# Patient Record
Sex: Male | Born: 1990 | Race: Black or African American | Hispanic: No | Marital: Single | Smoking: Never smoker
Health system: Southern US, Community
[De-identification: ages and names within clinical notes are randomized; demographics above are authoritative.]

## PROBLEM LIST (undated history)

## (undated) DIAGNOSIS — K603 Anal fistula: Secondary | ICD-10-CM

## (undated) DIAGNOSIS — L0291 Cutaneous abscess, unspecified: Secondary | ICD-10-CM

## (undated) DIAGNOSIS — A630 Anogenital (venereal) warts: Secondary | ICD-10-CM

## (undated) DIAGNOSIS — K602 Anal fissure, unspecified: Secondary | ICD-10-CM

## (undated) HISTORY — DX: Anal fistula: K60.3

## (undated) HISTORY — DX: Anogenital (venereal) warts: A63.0

## (undated) HISTORY — DX: Cutaneous abscess, unspecified: L02.91

## (undated) HISTORY — DX: Anal fissure, unspecified: K60.2

---
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
## 2009-08-21 ENCOUNTER — Emergency Department (HOSPITAL_COMMUNITY): Admission: EM | Admit: 2009-08-21 | Discharge: 2009-08-21 | Payer: Self-pay | Admitting: Family Medicine

## 2009-09-04 ENCOUNTER — Emergency Department (HOSPITAL_COMMUNITY): Admission: EM | Admit: 2009-09-04 | Discharge: 2009-09-05 | Payer: Self-pay | Admitting: Emergency Medicine

## 2009-10-11 HISTORY — PX: CONDYLOMA EXCISION/FULGURATION: SHX1389

## 2009-11-10 ENCOUNTER — Emergency Department (HOSPITAL_COMMUNITY)
Admission: EM | Admit: 2009-11-10 | Discharge: 2009-11-10 | Payer: Self-pay | Source: Home / Self Care | Admitting: Family Medicine

## 2010-01-22 ENCOUNTER — Emergency Department (HOSPITAL_COMMUNITY)
Admission: EM | Admit: 2010-01-22 | Discharge: 2010-01-23 | Payer: Self-pay | Source: Home / Self Care | Admitting: Emergency Medicine

## 2010-01-23 ENCOUNTER — Encounter (INDEPENDENT_AMBULATORY_CARE_PROVIDER_SITE_OTHER): Payer: Self-pay | Admitting: *Deleted

## 2010-01-23 ENCOUNTER — Telehealth: Payer: Self-pay | Admitting: Gastroenterology

## 2010-02-05 ENCOUNTER — Encounter (INDEPENDENT_AMBULATORY_CARE_PROVIDER_SITE_OTHER): Payer: Self-pay | Admitting: *Deleted

## 2010-02-05 ENCOUNTER — Ambulatory Visit: Payer: Self-pay | Admitting: Gastroenterology

## 2010-02-05 DIAGNOSIS — K625 Hemorrhage of anus and rectum: Secondary | ICD-10-CM | POA: Insufficient documentation

## 2010-02-05 DIAGNOSIS — E739 Lactose intolerance, unspecified: Secondary | ICD-10-CM | POA: Insufficient documentation

## 2010-02-05 DIAGNOSIS — R195 Other fecal abnormalities: Secondary | ICD-10-CM | POA: Insufficient documentation

## 2010-02-05 LAB — CONVERTED CEMR LAB
ALT: 31 units/L (ref 0–53)
AST: 28 units/L (ref 0–37)
Albumin: 4.2 g/dL (ref 3.5–5.2)
Alkaline Phosphatase: 47 units/L (ref 39–117)
Bilirubin, Direct: 0.1 mg/dL (ref 0.0–0.3)
CO2: 25 meq/L (ref 19–32)
Calcium: 8.8 mg/dL (ref 8.4–10.5)
Chloride: 108 meq/L (ref 96–112)
Eosinophils Relative: 2 % (ref 0.0–5.0)
Glucose, Bld: 85 mg/dL (ref 70–99)
HCT: 45.9 % (ref 39.0–52.0)
Lymphocytes Relative: 37.3 % (ref 12.0–46.0)
Lymphs Abs: 1.3 10*3/uL (ref 0.7–4.0)
Monocytes Relative: 6.1 % (ref 3.0–12.0)
Platelets: 157 10*3/uL (ref 150.0–400.0)
Potassium: 4 meq/L (ref 3.5–5.1)
Saturation Ratios: 33.5 % (ref 20.0–50.0)
Sed Rate: 5 mm/hr (ref 0–22)
Sodium: 138 meq/L (ref 135–145)
Total Protein: 7.3 g/dL (ref 6.0–8.3)
Transferrin: 264.3 mg/dL (ref 212.0–360.0)
WBC: 3.4 10*3/uL — ABNORMAL LOW (ref 4.5–10.5)

## 2010-02-23 ENCOUNTER — Ambulatory Visit: Payer: Self-pay | Admitting: Gastroenterology

## 2010-02-24 ENCOUNTER — Telehealth: Payer: Self-pay | Admitting: Gastroenterology

## 2010-02-25 ENCOUNTER — Ambulatory Visit: Payer: Self-pay | Admitting: Gastroenterology

## 2010-02-25 DIAGNOSIS — B079 Viral wart, unspecified: Secondary | ICD-10-CM | POA: Insufficient documentation

## 2010-02-25 LAB — CONVERTED CEMR LAB

## 2010-03-06 ENCOUNTER — Ambulatory Visit: Payer: Self-pay | Admitting: Gastroenterology

## 2010-03-16 ENCOUNTER — Encounter: Payer: Self-pay | Admitting: Gastroenterology

## 2010-04-09 ENCOUNTER — Encounter: Payer: Self-pay | Admitting: General Surgery

## 2010-04-09 ENCOUNTER — Observation Stay (HOSPITAL_COMMUNITY): Admission: AD | Admit: 2010-04-09 | Discharge: 2010-04-10 | Payer: Self-pay | Admitting: General Surgery

## 2010-04-09 HISTORY — PX: CONDYLOMA EXCISION/FULGURATION: SHX1389

## 2010-04-15 ENCOUNTER — Emergency Department (HOSPITAL_COMMUNITY)
Admission: EM | Admit: 2010-04-15 | Discharge: 2010-04-15 | Payer: Self-pay | Source: Home / Self Care | Admitting: Emergency Medicine

## 2010-07-10 ENCOUNTER — Encounter: Payer: Self-pay | Admitting: Gastroenterology

## 2010-07-14 NOTE — Assessment & Plan Note (Signed)
Summary: RECTAL BLEEDING...AS.   History of Present Illness Visit Type: Initial Visit Primary GI MD: Sheryn Bison MD FACP FAGA Primary Provider: Jasper Loser, MD Chief Complaint: rectal bleeding with bowel movements, constipation History of Present Illness:   20 year old African American male referred through [facility] long emergency room for evaluation of recurrent rectal bleeding. This has been in place for 2 months, apparently was initially evaluated in [place], and colonoscopy was recommended.  Patient has had several months of rectal bleeding and developed acute rectal pain on August 12 requiring urgency room visit. At that time rectal exam was unremarkable the patient was treated Anusol suppositories, lidocaine gel, and MiraLax. He denies current rectal pain, but continues with bright red blood per rectum. He denies upper gastrointestinal or hepatobiliary complaints. His appetite is good, his weight is stable,he does have lactose intolerance. Family history noncontributory.   GI Review of Systems    Reports abdominal pain and  acid reflux.     Location of  Abdominal pain: left side.    Denies belching, bloating, chest pain, dysphagia with liquids, dysphagia with solids, heartburn, loss of appetite, nausea, vomiting, vomiting blood, weight loss, and  weight gain.      Reports black tarry stools, change in bowel habits, constipation, heme positive stool, rectal bleeding, and  rectal pain.     Denies anal fissure, diarrhea, diverticulosis, fecal incontinence, hemorrhoids, irritable bowel syndrome, jaundice, light color stool, and  liver problems.    Current Medications (verified): 1)  Anusol-Hc 25 Mg Supp (Hydrocortisone Acetate) .... As Needed 2)  Polyethylene Glycol 3350  Powd (Polyethylene Glycol 3350) .... Take As Directed Once Daily As Needed 3)  Lidocaine Hcl 4 % Soln (Lidocaine Hcl) .... Apply As Needed For Pain  Allergies (verified): No Known Drug Allergies  Past  History:  Past medical, surgical, family and social histories (including risk factors) reviewed for relevance to current acute and chronic problems.  Past Medical History: Reviewed history from 02/04/2010 and no changes required. Asthma Allergies  Past Surgical History: Unremarkable  Family History: Reviewed history and no changes required. Family History of Colon Cancer:GM Family History of Heart Disease:   Social History: Reviewed history from 02/04/2010 and no changes required. Patient has never smoked.  Alcohol Use - yes Occasional Illicit Drug Use - no Occupation: Student  Review of Systems       The patient complains of allergy/sinus.  The patient denies anemia, anxiety-new, arthritis/joint pain, back pain, blood in urine, breast changes/lumps, change in vision, confusion, cough, coughing up blood, depression-new, fainting, fatigue, fever, headaches-new, hearing problems, heart murmur, heart rhythm changes, itching, menstrual pain, muscle pains/cramps, night sweats, nosebleeds, pregnancy symptoms, shortness of breath, skin rash, sleeping problems, sore throat, swelling of feet/legs, swollen lymph glands, thirst - excessive , urination - excessive , urination changes/pain, urine leakage, vision changes, and voice change.    Vital Signs:  Patient profile:   20 year old male Height:      73 inches Weight:      186 pounds BMI:     24.63 Pulse rate:   72 / minute Pulse rhythm:   regular BP sitting:   124 / 86  (left arm) Cuff size:   regular  Vitals Entered By: June McMurray CMA Duncan Dull) (February 05, 2010 10:52 AM)  Physical Exam  General:  Well developed, well nourished, no acute distress.healthy appearing.   Head:  Normocephalic and atraumatic. Eyes:  PERRLA, no icterus.exam deferred to patient's ophthalmologist.   Neck:  Supple;  no masses or thyromegaly. Lungs:  Clear throughout to auscultation. Heart:  Regular rate and rhythm; no murmurs, rubs,  or bruits. Abdomen:   Soft, nontender and nondistended. No masses, hepatosplenomegaly or hernias noted. Normal bowel sounds. Rectal:  Normal exam.hemoccult positive.  Some mucus and blood noted. Prostate:  .normal size prostate.   Extremities:  No clubbing, cyanosis, edema or deformities noted. Neurologic:  Alert and  oriented x4;  grossly normal neurologically. Cervical Nodes:  No significant cervical adenopathy. Psych:  Alert and cooperative. Normal mood and affect.   Impression & Recommendations:  Problem # 1:  RECTAL BLEEDING (ICD-569.3) Assessment Unchanged No obvious fissure or fistula noted or mass on rectal exam. 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 Labs have been ordered and we will schedule colonoscopy. He is to continue stool softeners as tolerated and I have prescribed Canasa 1 g suppositories at bed time pending further workup. Orders: TLB-CBC Platelet - w/Differential (85025-CBCD) TLB-BMP (Basic Metabolic Panel-BMET) (80048-METABOL) TLB-Hepatic/Liver Function Pnl (80076-HEPATIC) TLB-TSH (Thyroid Stimulating Hormone) (84443-TSH) TLB-B12, Serum-Total ONLY (16109-U04) TLB-Ferritin (82728-FER) TLB-Folic Acid (Folate) (82746-FOL) TLB-IBC Pnl (Iron/FE;Transferrin) (83550-IBC) TLB-Sedimentation Rate (ESR) (85652-ESR)  Problem # 2:  BLOOD IN STOOL, OCCULT (ICD-792.1) Assessment: Unchanged labs ordered. Orders: TLB-CBC Platelet - w/Differential (85025-CBCD) TLB-BMP (Basic Metabolic Panel-BMET) (80048-METABOL) TLB-Hepatic/Liver Function Pnl (80076-HEPATIC) TLB-TSH (Thyroid Stimulating Hormone) (84443-TSH) TLB-B12, Serum-Total ONLY (54098-J19) TLB-Ferritin (82728-FER) TLB-Folic Acid (Folate) (82746-FOL) TLB-IBC Pnl (Iron/FE;Transferrin) (83550-IBC) TLB-Sedimentation Rate (ESR) (85652-ESR)  Problem # 3:  LACTOSE INTOLERANCE (ICD-271.3) Assessment: Unchanged  Patient Instructions: 1)  Please go to the  basement for lab work. 2)  Begin canasa suppository at bedtime. 1 gm. 3)  You are scheduled for a colonoscopy. 4)  The medication list was reviewed and reconciled.  All changed / newly prescribed medications were explained.  A complete medication list was provided to the patient / caregiver. 5)  Constipation and Hemorrhoids brochure given.  6)  Colonoscopy and Flexible Sigmoidoscopy brochure given.  7)  Conscious Sedation brochure given.  8)  Copy sent to : Dr. Jasper Loser.  Appended Document: RECTAL BLEEDING...AS.    Clinical Lists Changes  Medications: Added new medication of MOVIPREP 100 GM  SOLR (PEG-KCL-NACL-NASULF-NA ASC-C) As per prep instructions. - Signed Added new medication of CANASA 1000 MG  SUPP (MESALAMINE) 1 per rectum at bedtime - Signed Rx of MOVIPREP 100 GM  SOLR (PEG-KCL-NACL-NASULF-NA ASC-C) As per prep instructions.;  #1 x 0;  Signed;  Entered by: Ashok Cordia RN;  Authorized by: Mardella Layman MD [facility];  Method used: Electronically to [facility], [place], [place]  [id number], Ph: [phone], Fax: [phone] Rx of CANASA 1000 MG  SUPP (MESALAMINE) 1 per rectum at bedtime;  #30 x 0;  Signed;  Entered by: Ashok Cordia RN;  Authorized by: Mardella Layman MD [facility];  Method used: Electronically to [facility], [place], [place]  [id number], Ph: [phone], Fax: [phone] Orders: Added new Test order of Colonoscopy (Colon) - Signed    Prescriptions: CANASA 1000 MG  SUPP (MESALAMINE) 1 per rectum at bedtime  #30 x 0   Entered by:   Ashok Cordia RN   Authorized by:   Mardella Layman MD [facility]   Signed by:   Ashok Cordia RN on 02/05/2010   Method used:   Electronically to        [facility] (retail)  [address]       [place], [place]  [id number]       Ph: [phone]       Fax: [phone]   RxID:   [id number] MOVIPREP 100 GM  SOLR  (PEG-KCL-NACL-NASULF-NA ASC-C) As per prep instructions.  #1 x 0   Entered by:   Ashok Cordia RN   Authorized by:   Mardella Layman MD [facility]   Signed by:   Ashok Cordia RN on 02/05/2010   Method used:   Electronically to        [facility] (retail)       [address]       [place], [place]  [id number]       Ph: [phone]       Fax: [phone]   RxID:   [id number]

## 2010-07-14 NOTE — Letter (Signed)
Summary: New Patient letter  [facility]  [address] [place], [place] [address]   Phone: [phone]  Fax: [phone]       01/23/2010 MRN: [id number]  [facility] [address], [place]  [phone]  Dear Jack Smith,  Welcome to the Gastroenterology Division at [facility].    You are scheduled to see Dr. Jarold Motto on 03/10/2010 at 2:00PM on the 3rd floor at [facility], [address]  We ask that you try to arrive at our office 15 minutes prior to your appointment time to allow for check-in.  We would like you to complete the enclosed self-administered evaluation form prior to your visit and bring it with you on the day of your appointment.  We will review it with you.  Also, please bring a complete list of all your medications or, if you prefer, bring the medication bottles and we will list them.  Please bring your insurance card so that we may make a copy of it.  If your insurance requires a referral to see a specialist, please bring your referral form from your primary care physician.  Co-payments are due at the time of your visit and may be paid by cash, check or credit card.     Your office visit will consist of a consult with your physician (includes a physical exam), any laboratory testing he/she may order, scheduling of any necessary diagnostic testing (e.g. x-ray, ultrasound, CT-scan), and scheduling of a procedure (e.g. Endoscopy, Colonoscopy) if required.  Please allow enough time on your schedule to allow for any/all of these possibilities.    If you cannot keep your appointment, please call [phone] to cancel or reschedule prior to your appointment date.  This allows [place] the opportunity to schedule an appointment for another patient in need of care.  If you do not cancel or reschedule by 5 p.m. the business day prior to your appointment date, you will be charged a $50.00 late cancellation/no-show fee.    Thank you for choosing [facility] for your medical needs.  We appreciate the opportunity to care for you.  Please visit [place] at our website  to learn more about our practice.                     Sincerely,                                                             The Gastroenterology Division

## 2010-07-14 NOTE — Progress Notes (Signed)
Summary: Triage  Phone Note Call from Patient Call back at Home Phone [phone]   Caller: Patient Call For: Dr. Jarold Motto Reason for Call: Talk to Nurse Summary of Call: Pt was told yesterday he has Condyloma and would like to discuss Initial call taken by: Karna Christmas,  February 24, 2010 9:00 AM  Follow-up for Phone Call        await biopsy results first.... Follow-up by: Mardella Layman MD FACG,  February 24, 2010 9:48 AM  Additional Follow-up for Phone Call Additional follow up Details #1::        spoke to the patient and scheduled follow up ov and a appt with [facility] Additional Follow-up by: Harlow Mares CMA (AAMA),  March 02, 2010 11:01 AM

## 2010-07-14 NOTE — Procedures (Signed)
Summary: Colonoscopy  Patient: Jack Smith Note: All result statuses are Final unless otherwise noted.  Tests: (1) Colonoscopy (COL)   COL Colonoscopy           DONE     [facility]     [address]     [place], [place]  [address]           COLONOSCOPY PROCEDURE REPORT           PATIENT:  Jack Smith, Jack Smith  MR#:  [id number]     BIRTHDATE:  22-Nov-1990, 19 yrs. old  GENDER:  male     ENDOSCOPIST:  Vania Rea. Jarold Motto, MD, [facility]     REF. BY:     PROCEDURE DATE:  02/23/2010     PROCEDURE:  Colonoscopy with biopsy     ASA CLASS:  Class I     INDICATIONS:  rectal bleeding     MEDICATIONS:   Fentanyl 75 mcg IV, Versed 7 mg IV           DESCRIPTION OF PROCEDURE:   After the risks benefits and     alternatives of the procedure were thoroughly explained, informed     consent was obtained.  Digital rectal exam was performed and     revealed no abnormalities.   The LB [id number] endoscope     was introduced through the anus and advanced to the cecum, which     was identified by both the appendix and ileocecal valve, without     limitations.  The quality of the prep was excellent, using     MoviPrep.  The instrument was then slowly withdrawn as the colon     was fully examined.     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 ANAL AND     RECTAL LESIONS BIOPSIED.  No polyps or cancers were seen.  This     was otherwise a normal examination of the colon.   SEE PICTURES.     The scope was then withdrawn from the patient and the procedure     completed.           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     REPEAT EXAM:  No           ______________________________     Vania Rea. Jarold Motto, MD, Clement[facility] Graham           CC:        n.     eSIGNED:   Vania Rea. Patterson at 02/23/2010 03:56 PM           Schueler, [name], [phone]  Note: An exclamation mark (!) indicates a result that was not dispersed into the flowsheet. Document Creation Date: 02/23/2010 3:57 PM _______________________________________________________________________  (1) Order result status: Final Collection or observation date-time: 02/23/2010 15:45 Requested date-time:  Receipt date-time:  Reported date-time:  Referring Physician:   Ordering Physician: Sheryn Bison [phone]) Specimen Source:  Source: Launa Grill Order Number: [id number] Lab site:

## 2010-07-14 NOTE — Letter (Signed)
Summary: [facility]   Imported By: Lennie Odor 04/08/2010 12:07:33  _____________________________________________________________________  External Attachment:    Type:   Image     Comment:   External Document

## 2010-07-14 NOTE — Letter (Signed)
Summary: [facility] Instructions  [facility]  [address] [place], [place] [address]   Phone: [phone]  Fax: [phone]       Selby Smelcer    1991-01-10    MRN: [id number]        Procedure Day /Date: Monday, 02/23/10     Arrival Time: 2:30      Procedure Time: 3:30     [facility] of Procedure:                    [facility]                        PREPARATION FOR COLONOSCOPY WITH MOVIPREP   Starting 5 days prior to your procedure 02/18/10 do not eat nuts, seeds, popcorn, corn, beans, peas,  salads, or any raw vegetables.  Do not take any fiber supplements (e.g. Metamucil, Citrucel, and Benefiber).  THE DAY BEFORE YOUR PROCEDURE         DATE: 02/22/10   DAY: Sunday  1.  Drink clear liquids the entire day-NO SOLID FOOD  2.  Do not drink anything colored red or purple.  Avoid juices with pulp.  No orange juice.  3.  Drink at least 64 oz. (8 glasses) of fluid/clear liquids during the day to prevent dehydration and help the prep work efficiently.  CLEAR LIQUIDS INCLUDE: Water Jello Ice Popsicles Tea (sugar ok, no milk/cream) Powdered fruit flavored drinks Coffee (sugar ok, no milk/cream) Gatorade Juice: apple, white grape, white cranberry  Lemonade Clear bullion, consomm, broth Carbonated beverages (any kind) Strained chicken noodle soup Hard Candy                             4.  In the morning, mix first dose of MoviPrep solution:    Empty 1 Pouch A and 1 Pouch B into the disposable container    Add lukewarm drinking water to the top line of the container. Mix to dissolve    Refrigerate (mixed solution should be used within 24 hrs)  5.  Begin drinking the prep at 5:00 p.m. The MoviPrep container is divided by 4 marks.   Every 15 minutes drink the solution down to the next mark (approximately 8 oz) until the full liter is complete.   6.  Follow completed prep with 16 oz of clear liquid of your choice (Nothing red or  purple).  Continue to drink clear liquids until bedtime.  7.  Before going to bed, mix second dose of MoviPrep solution:    Empty 1 Pouch A and 1 Pouch B into the disposable container    Add lukewarm drinking water to the top line of the container. Mix to dissolve    Refrigerate  THE DAY OF YOUR PROCEDURE      DATE: 02/23/10    DAY: Monday  Beginning at 10:30a.m. (5 hours before procedure):         1. Every 15 minutes, drink the solution down to the next mark (approx 8 oz) until the full liter is complete.  2. Follow completed prep with 16 oz. of clear liquid of your choice.    3. You may drink clear liquids until _  _ (2 HOURS BEFORE PROCEDURE).   MEDICATION INSTRUCTIONS  Unless otherwise instructed, you should take regular prescription medications with a Cardona sip of water   as early as possible  the morning of your procedure.         OTHER INSTRUCTIONS  You will need a responsible adult at least 21 years of age to accompany you and drive you home.   This person must remain in the waiting room during your procedure.  Wear loose fitting clothing that is easily removed.  Leave jewelry and other valuables at home.  However, you may wish to bring a book to read or  an iPod/MP3 player to listen to music as you wait for your procedure to start.  Remove all body piercing jewelry and leave at home.  Total time from sign-in until discharge is approximately 2-3 hours.  You should go home directly after your procedure and rest.  You can resume normal activities the  day after your procedure.  The day of your procedure you should not:   Drive   Make legal decisions   Operate machinery   Drink alcohol   Return to work  You will receive specific instructions about eating, activities and medications before you leave.    The above instructions have been reviewed and explained to me by   _______________________    I fully understand and can verbalize these  instructions _____________________________ Date _________

## 2010-07-14 NOTE — Letter (Signed)
Summary: Patient Notice- Colon Biospy Results  [facility]  [address] [place], [place] [address]   Phone: [phone]  Fax: [phone]        February 25, 2010 MRN: [id number]    [facility] [address] [place], [address]   Dear Mr. Ballman,  I am pleased to inform you that the biopsies taken during your recent colonoscopy did not show any evidence of cancer upon pathologic examination.  Additional information/recommendations:  __No further action is needed at this time.  Please follow-up with      your primary care physician for your other healthcare needs.  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 AND FURTHER BLOOD WORK.  __Continue with the treatment plan as outlined on the day of your      exam.  __You should have a repeat colonoscopy examination for this problem           in _ years.  Please call us if you are having persistent problems or have questions about your condition that have not been fully answered at this time.  Sincerely,  Mardella Layman MD [facility]   This letter has been electronically signed by your physician.   Appended Document: Patient Notice- Colon Biospy Results letter mailed

## 2010-07-14 NOTE — Assessment & Plan Note (Signed)
Summary: Follow up CONDYLOMA/lk   History of Present Illness Primary GI MD: Sheryn Bison MD FACP FAGA Primary Provider: Jasper Loser, MD Chief Complaint: f/u pathology after colon. Pt still having rectal bleeding and pain with BM's. History of Present Illness:   This Patient is currently asymptomatic except for occasional bright red blood per rectum. Colonoscopy showed the presence of condyloma a biopsy. HIV titer was negative and test for syphilis was negative. Patient is surprised with this diagnosis relating that he does not have unprotected sex. We have scheduled him to see Dr. Gaynelle Adu and surgery for surgical treatment of his rather extensive anal condyloma. Several options exist between surgical excision and podofylin administration topically. I have given the patient printed information concerning the human papilloma virus, its etiology, clinical course and therapy.   GI Review of Systems      Denies abdominal pain, acid reflux, belching, bloating, chest pain, dysphagia with liquids, dysphagia with solids, heartburn, loss of appetite, nausea, vomiting, vomiting blood, weight loss, and  weight gain.      Reports rectal bleeding and  rectal pain.     Denies anal fissure, black tarry stools, change in bowel habit, constipation, diarrhea, diverticulosis, fecal incontinence, heme positive stool, hemorrhoids, irritable bowel syndrome, jaundice, light color stool, and  liver problems.    Current Medications (verified): 1)  None  Allergies (verified): No Known Drug Allergies  Past History:  Past medical, surgical, family and social histories (including risk factors) reviewed for relevance to current acute and chronic problems.  Past Medical History: Reviewed history from 02/04/2010 and no changes required. Asthma Allergies  Past Surgical History: Reviewed history from 02/05/2010 and no changes required. Unremarkable  Family History: Reviewed history from 02/05/2010 and no  changes required. Family History of Colon Cancer:GM Family History of Heart Disease:   Social History: Reviewed history from 02/05/2010 and no changes required. Patient has never smoked.  Alcohol Use - yes Occasional Illicit Drug Use - no Occupation: Student  Review of Systems  The patient denies allergy/sinus, anemia, anxiety-new, arthritis/joint pain, back pain, blood in urine, breast changes/lumps, change in vision, confusion, cough, coughing up blood, depression-new, fainting, fatigue, fever, headaches-new, hearing problems, heart murmur, heart rhythm changes, itching, menstrual pain, muscle pains/cramps, night sweats, nosebleeds, pregnancy symptoms, shortness of breath, skin rash, sleeping problems, sore throat, swelling of feet/legs, swollen lymph glands, thirst - excessive , urination - excessive , urination changes/pain, urine leakage, vision changes, and voice change.    Vital Signs:  Patient profile:   20 year old male Height:      73 inches Weight:      186.38 pounds Pulse rate:   80 / minute Pulse rhythm:   regular BP sitting:   138 / 72  (left arm) Cuff size:   regular  Vitals Entered By: Christie Nottingham CMA Duncan Dull) (March 06, 2010 9:54 AM)  Physical Exam  General:  Well developed, well nourished, no acute distress.healthy appearing.   Psych:  Alert and cooperative. Normal mood and affect.   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 Gaynelle Adu on October 3 of this year. And also reviewed probable sexual transmission of this virus and have urged continued safe sexual practices. His HIV titer was negative and PCR for syphilis was negative.  Problem # 2:  LACTOSE INTOLERANCE (ICD-271.3) Assessment: Unchanged  Problem # 3:  RECTAL BLEEDING (ICD-569.3) Assessment: Unchanged  Patient Instructions: 1)  Copy sent to :  Jasper Loser, MD and Dr. Gaynelle Adu at [facility] surgery 2)   Patient given information on Condyloma. 3)  Keep your appt with [facility] on 03/16/2010. 4)  Please continue current medications.  5)  The medication list was reviewed and reconciled.  All changed / newly prescribed medications were explained.  A complete medication list was provided to the patient / caregiver.

## 2010-07-14 NOTE — Progress Notes (Signed)
Summary: triage  Phone Note Call from Patient Call back at [phone]   Caller: mom--denise Call For: Doc of the day Reason for Call: Talk to Nurse Summary of Call: Mother would like he son to be seen today or sooner than appt date 9-27 for rectal bleeding and rectal pain. Patient was seen in the ER last night and was told to see a gi asap. Initial call taken by: Tawni Levy,  January 23, 2010 11:00 AM  Follow-up for Phone Call        Appt. moved up to 02/05/10..Mother will inform pt.and tell him about cx. policy and co-pay. Follow-up by: Teryl Lucy RN,  January 23, 2010 11:58 AM

## 2010-08-20 NOTE — Letter (Signed)
Summary: [facility]   Imported By: Lennie Odor 08/10/2010 10:22:14  _____________________________________________________________________  External Attachment:    Type:   Image     Comment:   External Document

## 2010-08-26 LAB — CBC
MCHC: 36.6 g/dL — ABNORMAL HIGH (ref 30.0–36.0)
Platelets: 157 10*3/uL (ref 150–400)
RDW: 12.2 % (ref 11.5–15.5)
WBC: 3.3 10*3/uL — ABNORMAL LOW (ref 4.0–10.5)

## 2010-08-31 LAB — GC/CHLAMYDIA PROBE AMP, GENITAL: Chlamydia, DNA Probe: NEGATIVE

## 2010-08-31 LAB — HIV ANTIBODY (ROUTINE TESTING W REFLEX): HIV: NONREACTIVE

## 2010-09-07 LAB — POCT I-STAT, CHEM 8
Calcium, Ion: 1.13 mmol/L (ref 1.12–1.32)
Chloride: 105 mEq/L (ref 96–112)
Creatinine, Ser: 1 mg/dL (ref 0.4–1.5)
Glucose, Bld: 84 mg/dL (ref 70–99)
HCT: 47 % (ref 39.0–52.0)
Hemoglobin: 16 g/dL (ref 13.0–17.0)
Potassium: 3.8 mEq/L (ref 3.5–5.1)

## 2010-09-28 ENCOUNTER — Emergency Department (HOSPITAL_COMMUNITY)
Admission: EM | Admit: 2010-09-28 | Discharge: 2010-09-29 | Disposition: A | Discharge status: Home / Self Care | Payer: Managed Care, Other (non HMO) | Attending: Emergency Medicine | Admitting: Emergency Medicine

## 2010-09-28 ENCOUNTER — Emergency Department (HOSPITAL_COMMUNITY): Payer: Managed Care, Other (non HMO)

## 2010-09-28 DIAGNOSIS — J069 Acute upper respiratory infection, unspecified: Secondary | ICD-10-CM | POA: Insufficient documentation

## 2010-09-28 DIAGNOSIS — R5381 Other malaise: Secondary | ICD-10-CM | POA: Insufficient documentation

## 2010-09-28 DIAGNOSIS — R059 Cough, unspecified: Secondary | ICD-10-CM | POA: Insufficient documentation

## 2010-09-28 DIAGNOSIS — Y849 Medical procedure, unspecified as the cause of abnormal reaction of the patient, or of later complication, without mention of misadventure at the time of the procedure: Secondary | ICD-10-CM | POA: Insufficient documentation

## 2010-09-28 DIAGNOSIS — R509 Fever, unspecified: Secondary | ICD-10-CM | POA: Insufficient documentation

## 2010-09-28 DIAGNOSIS — J45909 Unspecified asthma, uncomplicated: Secondary | ICD-10-CM | POA: Insufficient documentation

## 2010-09-28 DIAGNOSIS — K625 Hemorrhage of anus and rectum: Secondary | ICD-10-CM | POA: Insufficient documentation

## 2010-09-28 DIAGNOSIS — R05 Cough: Secondary | ICD-10-CM | POA: Insufficient documentation

## 2010-09-28 DIAGNOSIS — T8183XA Persistent postprocedural fistula, initial encounter: Secondary | ICD-10-CM | POA: Insufficient documentation

## 2010-09-28 LAB — DIFFERENTIAL
Basophils Absolute: 0 10*3/uL (ref 0.0–0.1)
Eosinophils Relative: 2 % (ref 0–5)
Lymphocytes Relative: 14 % (ref 12–46)
Monocytes Absolute: 0.5 10*3/uL (ref 0.1–1.0)
Monocytes Relative: 5 % (ref 3–12)
Neutro Abs: 8 10*3/uL — ABNORMAL HIGH (ref 1.7–7.7)

## 2010-09-28 LAB — URINALYSIS, ROUTINE W REFLEX MICROSCOPIC
Bilirubin Urine: NEGATIVE
Glucose, UA: 250 mg/dL — AB
Hgb urine dipstick: NEGATIVE
Ketones, ur: 15 mg/dL — AB
Protein, ur: 30 mg/dL — AB
Urobilinogen, UA: 0.2 mg/dL (ref 0.0–1.0)

## 2010-09-28 LAB — CBC
HCT: 45.2 % (ref 39.0–52.0)
Hemoglobin: 15.8 g/dL (ref 13.0–17.0)
MCH: 30.2 pg (ref 26.0–34.0)
MCHC: 35 g/dL (ref 30.0–36.0)
RDW: 12.2 % (ref 11.5–15.5)

## 2010-09-28 LAB — COMPREHENSIVE METABOLIC PANEL
ALT: 14 U/L (ref 0–53)
AST: 22 U/L (ref 0–37)
Alkaline Phosphatase: 55 U/L (ref 39–117)
CO2: 26 mEq/L (ref 19–32)
Calcium: 9.2 mg/dL (ref 8.4–10.5)
Chloride: 103 mEq/L (ref 96–112)
GFR calc Af Amer: >60 mL/min (ref >60)
GFR calc non Af Amer: >60 mL/min (ref >60)
Glucose, Bld: 85 mg/dL (ref 70–99)
Potassium: 3.6 mEq/L (ref 3.5–5.1)
Sodium: 140 mEq/L (ref 135–145)
Total Bilirubin: 0.8 mg/dL (ref 0.3–1.2)

## 2010-09-29 LAB — URINE CULTURE: Colony Count: NO GROWTH

## 2010-10-05 NOTE — Consult Note (Signed)
  NAME:  Jack Smith, Jack Smith                ACCOUNT NO.:  [id number]  MEDICAL RECORD NO.:  [id number]           PATIENT TYPE:  E  LOCATION:  WLED                         FACILITY:  [facility]  PHYSICIAN:  Adolph Pollack, M.D.DATE OF BIRTH:  December 26, 1990  DATE OF CONSULTATION:  09/28/2010 DATE OF DISCHARGE:  09/29/2010                                CONSULTATION   PHYSICIAN REQUESTING:  Carleene Cooper, M.D.  REASON FOR CONSULTATION:  Rectal bleeding, pain.  HISTORY:  This is a 20 year old male who presented to the emergency department, feeling weak, having fever and chills, cough, some shortness of breath, and nasal congestion.  He also complained about his last 2 bowel movements having a little bit of bright red blood per rectum and some pain.  He was seen by Dr. Ignacia Palma who noticed him to have a tender perianal region and was concerned about anorectal abscess and now was asked to see him.  In speaking with him, 4-5 days ago, he had excision of a thrombosed hemorrhoid by Dr. Bertram Savin.  He also has a history of an anorectal abscess and Dr. [name] has seen him and is concerned for the fistula in ano and he is due to have an operation for that on April 30th.  PAST MEDICAL HISTORY: 1. Condyloma acuminatum. 2. Perirectal abscess.  PREVIOUS OPERATIONS: 1. Excision of condyloma acuminata. 2. Incision and drainage of perirectal abscess.  ALLERGIES:  None.  MEDICATIONS:  None.  SOCIAL HISTORY:  Single male, occasionally drinks, nonsmoker in school.  REVIEW OF SYSTEMS:  Generally, he reports having some weakness and fever and chills began today.  PULMONARY:  He has been having some cough and intermittent dyspnea.  GI:  He has had chronic perianal pain ever since is rectal abscess, drainage, and his pain now is really no different.  PHYSICAL EXAMINATION:  GENERAL:  A well-developed, well-nourished male in no acute distress, pleasant, and cooperative. VITAL SIGNS:  Temperature is 101.3,  blood pressure 134/82, pulse 79, respiratory rate 16, O2 sats 98% room air.  An anorectal region, right posterior anal area, there is an open wound, it is tender.  There is some induration around this.  There is no fluctuance present.  There is no drainage.  He has normal sphincter tone.  There is no blood.  LABORATORY DATA:  Notable for normal white blood cell count.  IMPRESSION: 1. Upper respiratory infection. 2. Post excision of thrombosed external hemorrhoid with expected     postprocedure swelling and there is no evidence of abscess.  PLAN:  He can be treated for the upper respiratory infection by the emergency department physician.  We will have him discussed this with Dr. [name] to have him follow up with him as needed for the perianal problems.     Adolph Pollack, M.D.     Kari Baars  D:  09/28/2010  T:  09/29/2010  Job:  [id number]  Electronically Signed by Avel Peace M.D. on 10/05/2010 02:16:10 PM

## 2010-10-12 ENCOUNTER — Observation Stay (HOSPITAL_COMMUNITY)
Admission: RE | Admit: 2010-10-12 | Discharge: 2010-10-13 | Disposition: A | Discharge status: Home / Self Care | Payer: Managed Care, Other (non HMO) | Source: Ambulatory Visit | Attending: General Surgery | Admitting: General Surgery

## 2010-10-12 DIAGNOSIS — K603 Anal fistula, unspecified: Principal | ICD-10-CM | POA: Insufficient documentation

## 2010-10-12 DIAGNOSIS — B078 Other viral warts: Secondary | ICD-10-CM | POA: Insufficient documentation

## 2010-10-12 DIAGNOSIS — K602 Anal fissure, unspecified: Secondary | ICD-10-CM | POA: Insufficient documentation

## 2010-10-12 LAB — SURGICAL PCR SCREEN
MRSA, PCR: NEGATIVE
Staphylococcus aureus: NEGATIVE

## 2010-11-05 ENCOUNTER — Inpatient Hospital Stay: Admission: AD | Admit: 2010-11-05 | Payer: Self-pay | Source: Ambulatory Visit | Admitting: General Surgery

## 2010-11-05 ENCOUNTER — Other Ambulatory Visit: Payer: Self-pay | Admitting: General Surgery

## 2010-11-05 DIAGNOSIS — K611 Rectal abscess: Secondary | ICD-10-CM

## 2010-11-06 ENCOUNTER — Other Ambulatory Visit (HOSPITAL_COMMUNITY): Payer: Managed Care, Other (non HMO)

## 2010-11-06 ENCOUNTER — Ambulatory Visit (HOSPITAL_COMMUNITY)
Admission: RE | Admit: 2010-11-06 | Discharge: 2010-11-07 | Disposition: A | Discharge status: Home / Self Care | Payer: Managed Care, Other (non HMO) | Source: Ambulatory Visit | Attending: General Surgery | Admitting: General Surgery

## 2010-11-06 ENCOUNTER — Ambulatory Visit (HOSPITAL_COMMUNITY)
Admission: RE | Admit: 2010-11-06 | Discharge: 2010-11-06 | Disposition: A | Discharge status: Home / Self Care | Payer: Managed Care, Other (non HMO) | Source: Ambulatory Visit | Attending: General Surgery | Admitting: General Surgery

## 2010-11-06 DIAGNOSIS — K611 Rectal abscess: Secondary | ICD-10-CM

## 2010-11-06 DIAGNOSIS — K612 Anorectal abscess: Secondary | ICD-10-CM | POA: Insufficient documentation

## 2010-11-06 DIAGNOSIS — K602 Anal fissure, unspecified: Secondary | ICD-10-CM | POA: Insufficient documentation

## 2010-11-06 HISTORY — PX: OTHER SURGICAL HISTORY: SHX169

## 2010-11-06 MED ORDER — GADOBENATE DIMEGLUMINE 529 MG/ML IV SOLN
15.0000 mL | Freq: Once | INTRAVENOUS | Status: AC
  Administered 2010-11-06: 15 mL via INTRAVENOUS

## 2010-11-06 NOTE — Op Note (Signed)
NAME:  Jack Smith, Jack Smith                ACCOUNT NO.:  [id number]  MEDICAL RECORD NO.:  [id number]           PATIENT TYPE:  O  LOCATION:  5118                         FACILITY:  [facility]  PHYSICIAN:  [name]. [name], MD     DATE OF BIRTH:  1991/05/28  DATE OF PROCEDURE:  10/12/2010 DATE OF DISCHARGE:                              OPERATIVE REPORT   PREOPERATIVE DIAGNOSES:  Perianal drainage, probable fistula in ano, anal canal warts.  POSTOPERATIVE DIAGNOSES: 1. Left posterolateral transsphincteric fistula. 2. Posterior midline anal fissure. 3. Anal canal warts.  PROCEDURES: 1. Exam under anesthesia. 2. Excision and fulguration of anal canal warts. 3. Placement of a Seton in the left posterolateral transsphincteric     fistula.  SURGEON:  [name]. [name], MD  ASSISTANT:  None.  ANESTHESIA:  General plus 20 mL of 0.25% Marcaine with epi.  FINDINGS:  The patient had essentially circumferential spotty patches of anal canal warts.  He had a fissure in his posterior midline.  There was a gap in the mucosa for about 0.5 cm and I could visualize his internal anal sphincter.  In clinic, we had seen intermittent drainage in the left posterolateral position.  This area was probed with Angiocath with hydrogen peroxide.  When I injected this left posterolateral perianal site the peroxide extruded into the anal canal about 1.5cm from the anal verge in the posterior midline appearuing to be transsphincteric.  INDICATIONS FOR PROCEDURE:  The patient is a 20 year old gentleman that I initially saw back in the fall, who had anal canal warts.  We had taken him to the operating room and did an exam under anesthesia and excision & fulguration of his anal canal warts.  He initially did well except for a period of postoperative discomfort.  Unfortunately he developed recurrent anal canal warts in a few areas.  Over the holidays he ended up being in [place] where he developed rectal pain.  He  was admitted to an outside hospital and taken to the operating room for exam under anesthesia.  They probed the area with a syringe with needle and were unable to find have an abscess and they were unable to identify a fistula at that time.  He was discharged and readmitted several days later for ongoing pain.  He underwent a CT scan which did not demonstrate any signs of an abscess, however, he was placed on IV antibiotics as well as pain control and ultimately discharged.  He came back down to [place] for school.  He mainly complained of intermittent drainage.  On visual inspection, there is one spot in the left posterolateral position about 1 cm from the anal verge that when I pressed on it, I could get some intermittent drainage from it.  We discussed that this was more than likely a fistula in ano as well as his recurrent warts.  We had planned to go back to the operating room to manage this; however, he returned to clinic with worsening and essentially new-onset rectal pain.  I saw him in the clinic, additional rectal exam was attempted, however, there was what appeared to be  a boggy mass in his posterior midline.  At this point, I recommended that we proceed to the operating room for an exam under anesthesia, possible Seton placement, possible excision of rectal mass, possible excision and fulguration of anal canal warts.  I did explain that this more than likely to be a staged procedure.  We discussed the risks and benefits of surgery including bleeding, infection, injury to surrounding structures, anal stenosis, incontinence to flatus, stool, and urinary retention as well as multistage procedure.  He elects to proceed to surgery.  DESCRIPTION OF PROCEDURE:  After obtaining informed consent, the patient was brought to the operating room.  General endotracheal anesthesia was established.  He was then placed in the prone jackknife position.  The appropriate padding was placed.   Sequential compression device had already been placed.  He received Tylenol as well as cefoxitin prior to skin incision.  His buttocks were taped apart and his perineum was prepped and draped in usual standard surgical fashion with Betadine. Additional rectal exam was performed, which demonstrated good tone.  I really could not feel or appreciate that large boggy mass that I felt in his posterior midline in the office a few days ago.  Anoscopy was then performed and I visualized essentially circumferential spotty patch of the anal canal warts.  He had no signs of warts on his perineum.  Upon placing the anoscope and looking in the posterior midline position, he had a gap in his mucosa, which appeared consistent with posterior midline anal fissure.  The burden of warts was most prominent in the posterior position.  I identified the external opening that I had noticed in the clinic, it was about 1.5 cm from the anal verge.  Using a 10 mL syringe filled with hydrogen peroxide, an Angiocath tip injection of hydrogen peroxide into the opening and additional looking and inspecting the anal canal.  The hydrogen peroxide immediately drained into the anal canal in the posterior midline position.  This was about 2 cm within the canal and then using a lacrimal probe, I placed it through the external opening and gently probed the area.  I could find no additional tracts.  Again we injected hydrogen peroxide again and looked in the left lateral, right lateral, and posterior midline and I could not identify any other drainage of hydrogen peroxide into the anal canal.  There was no other signs of fistulous track.  At this point, I threaded a Seton through the fistula tract.  It appeared to be transsphincteric.  It did not appear intrasphincteric.  Two silk ties were tied around the end of the [place] and the excess Seton vessel loop was trimmed.  At this point, circumferentially using the needle tip Bovie  cauterized and fulgurated the anal canal warts in a circumferential pattern.  All the warts were cauterized with the Bovie electrocautery taking care to have the special suction device in the field.   None of the sphincter appeared to be violated.  We reinspected the area for hemostasis.  A piece of Gelfoam was placed within the rectum.  4x4s and mesh underwear were then applied.  The patient was then rolled onto the stretcher, extubated, and taken to the recovery room in stable condition.  All needle, instrument, and sponge counts were correct x2.  There were no immediate complications.  The patient tolerated the procedure well.     [name]. [name], MD     EMW/MEDQ  D:  10/12/2010  T:  10/13/2010  Job:  [id number]  Electronically Signed by Gaynelle Adu M.D. on 11/06/2010 09:05:41 AM

## 2010-11-09 LAB — CULTURE, ROUTINE-ABSCESS

## 2010-11-11 NOTE — Op Note (Signed)
NAME:  Jack Smith, Jack Smith                ACCOUNT NO.:  [id number]  MEDICAL RECORD NO.:  [id number]           PATIENT TYPE:  I  LOCATION:  5159                         FACILITY:  [facility]  PHYSICIAN:  [name]. [name], MD     DATE OF BIRTH:  Jun 22, 1990  DATE OF PROCEDURE:  11/06/2010 DATE OF DISCHARGE:                              OPERATIVE REPORT   PREOPERATIVE DIAGNOSES: 1. Anal fissure. 2. Fistula in ano status post Seton placement. 3. History of anal canal warts. 4. Right perianal abscess.  POSTOPERATIVE DIAGNOSES: 1. Anal fissure. 2. Fistula in ano status post Seton placement. 3. History of anal canal warts. 4. Right anterior ischiorectal abscess.  PROCEDURE:  Exam under anesthesia, incision and drainage of right anterior ischiorectal abscess.  SURGEON:  [name]. [name], MD  ASSISTANT SURGEON:  Gabrielle Dare. Janee Morn, MD  ANESTHESIA:  General plus 30 mL of enalapril.  SPECIMEN:  Abscess.  Aerobic and anaerobic stain cultures.  ESTIMATED BLOOD LOSS:  Minimal.  FINDINGS:  The patient had a large area of fluctuance in his right anterior perianal position.  This was turned out to be an ischiorectal abscess.  The cavity extended posteriorly about 4 cm.  There was frank bloody pus that drained out which was sent for culture.  On anoscopy, there was no signs of anal canal warts.  He had a C-ton placed in the posterior midline slightly to the left as well as anal fissure directly underneath the C-ton.  INDICATIONS FOR PROCEDURE:  Jack Smith is a very pleasant 20 year old African American male who has had a complicated anal canal and perianal history.  I initially saw him back in the fall for anal canal warts.  We have taken him to the operating room in the fall for exam under anesthesia with excision and fulguration of anal canal warts.  He did moderately okay with respect to his pain postoperatively, but subsequently was found to have recurrence of his anal canal warts.  He was admitted  to a hospital in [place] around [place] with perirectal pain.  He had been actually taken to the operating room there for exam under anesthesia and was unable to find evidence of a fistula or an abscess.  He was subsequently discharged but bounce back to the hospital 2 days later for continued pain.  A CT scan of his pelvis was performed which demonstrated no overt abscess and he was admitted for pain control and IV antibiotics.  He was subsequently discharged and he came back down to [facility] and he reported intermittent body drainage. On exam in the office, he was found to have a left posterior lateral sinus about 1.5 cm from the anal verge as well as recurrence of his anal canal warts.  Intermittent drainage was concerning for fistula in ano. We took him to the operating room earlier this month where he underwent exam under anesthesia, excision and fulguration of his anal canal warts as well as to C-ton placement for the transenteric fistula.  He is also found to have an anal fissure in that area.  Postoperatively considering all of this has been going, he had  done reasonably well until the past several days where he started having increasing severe rectal pain.  He came to the office yesterday and was unable to sit flat on his buttocks. On exam, he is found to have an area of fluctuance in the right anterior position.  This was consistent with an abscess.  He just then elected to wait to come to the operating room today.  We did an MRI of his pelvis before surgery this morning given his cause of his extensive rectal history and anal canal history as well as I was just wanted to make sure there was no signs of a horseshoe abscess or new signs of a fistula. The MRI of the pelvis just demonstrated 2.5 1.2 cm right anterior perirectal abscess.  We discussed the risks and benefits of surgery including bleeding, infection, injury to surrounding structures, need for additional  procedures, urinary retention, DVT occurrence and ongoing need for intervention.  He elected to proceed with surgery.  DESCRIPTION OF PROCEDURE:  The patient was taken to the operating room. General endotracheal anesthesia was established.  He was then placed in the prone jackknife position with appropriate padding.  His buttocks were taped apart and his perineum and anus was prepped with Betadine. He received NSAIDs prior to skin incision.  He received IV Tylenol.  On gross inspection, there was no signs of cellulitis.  The C-ton was in good position in the left posterior lateral position.  There was the area of fluctuance in the right anterior position.  Anoscopy was performed which demonstrated the fissure in the posterior midline with the C-ton directly going over top of that.  There was no signs of anal canal wart recurrence.  There was no signs of bogginess or fluctuance or abscess within the anal canal.  The anal canal appeared very healthy except in the posterior midline where the fissure was and the C-ton was. I made a little over 1.5 inch right anterolateral incision over the maximal area of induration and fluctuance that I had a frank amount of bloody purulent drainage.  Aerobic and anaerobic cultures were obtained. Then using a Tresa Endo, I probed the area.  The cavity was quite large and extended back posteriorly toward his coccyx.  It did not track toward the anus or rectum.  It did not track anteriorly toward the perineum. It just extended posterolateral.  The cavity was irrigated.  Hemostasis was achieved.  I packed cavity with 1-inch Iodoform gauze.  I then injected 30 mL of enalapril in a regional block around his perineum. The patient was then placed in the supine position on the stretcher, extubated, and taken to the recovery room in stable condition.  There were no immediate complications.  The patient tolerated the procedure well.     [name]. [name],  MD     EMW/MEDQ  D:  11/06/2010  T:  11/07/2010  Job:  [id number]  Electronically Signed by Gaynelle Adu M.D. on 11/11/2010 09:07:43 AM

## 2010-12-24 ENCOUNTER — Encounter (INDEPENDENT_AMBULATORY_CARE_PROVIDER_SITE_OTHER): Payer: Self-pay | Admitting: General Surgery

## 2010-12-24 ENCOUNTER — Ambulatory Visit (INDEPENDENT_AMBULATORY_CARE_PROVIDER_SITE_OTHER): Payer: Managed Care, Other (non HMO) | Admitting: General Surgery

## 2010-12-24 VITALS — Temp 98.0°F

## 2010-12-24 DIAGNOSIS — K603 Anal fistula, unspecified: Secondary | ICD-10-CM | POA: Insufficient documentation

## 2010-12-24 DIAGNOSIS — K612 Anorectal abscess: Secondary | ICD-10-CM

## 2010-12-24 DIAGNOSIS — K6139 Other ischiorectal abscess: Secondary | ICD-10-CM | POA: Insufficient documentation

## 2010-12-24 MED ORDER — TRAMADOL HCL 50 MG PO TABS: 50.0000 mg | ORAL_TABLET | Freq: Four times a day (QID) | ORAL | Status: DC | PRN

## 2010-12-24 NOTE — Progress Notes (Signed)
Patient is a 20 year old African male with a complex rectal history. He most recently underwent an incision and drainage of a right ischiorectal  abscess on Nov 06, 2010. She has a history of anal condyloma acuminata. He underwent excision and fulguration for this back in the fall of 2011. He subsequently developed an anal fissure as well as a fistula in ano in the same location.  He was taken back to the operating room on April 30 where he underwent an exam under anesthesia as well as a seton placement for the anal fissure. He was after this procedure that he developed the right ischiorectal abscess. Since the 25th of may he has actually done quite well.  However he comes in today complaining of 3 days of soreness at the prior incision site for the abscess. He states that it started spontaneously draining 2 days ago. Has been draining yellowish fluid. He denies any fevers, chills, nausea, vomiting, diarrhea, or constipation. He is no longer having much discomfort since it started draining.  Physical exam Temp(Src) 98 F (36.7 C) (Temporal)  Well-developed well-nourished African American male in no apparent distress Abdomen-soft nontender nondistended Rectal-well healed 1 inch right lateral incision with a little bit of yellowish drainage through the midpoint of the incision. No cellulitis. no fluctuance. Nontender. Burnadette Pop is in the posterior midline in good position  Assessment and plan Right ischiorectal abscess I think the skin incision closed up before the cavity had obliterated. The area was prepped with Betadine and infiltrated 8 cc of 1% Xylocaine in the area. Using an 11 blade scalpel, I reopened the previous incision. The cavity was probed with a Q-tip. It tracked posteriorly. It did not track toward the anus. The cavity was packed with quarter inch iodoform gauze. 4 x 4's were then placed over this.  I will see the patient next week at his regularly scheduled appointment. The patient was  given wound care instructions. He was given a prescription for Ultram. I do not see a need to place him on antibiotics. He will call is if his symptoms worsen.

## 2010-12-24 NOTE — Patient Instructions (Signed)
Peri-Rectal Abscess (Abscess Around the Rectum) Your caregiver has diagnosed you as having a peri-rectal abscess. This is an infected area near the rectum that is filled with pus. If the abscess is near the surface of the skin, your caregiver may open (incise) the area and drain the pus. HOME CARE INSTRUCTIONS  If your abscess was opened up and drained. A Hacker piece of gauze may be placed in the opening so that it can drain. Do not remove the gauze unless directed by your caregiver.   A loose dressing may be placed over the abscess site. Change the dressing as often as necessary to keep it clean and dry.   After the drain is removed, the area may be washed with a gentle antiseptic (soap) four times per day.   A warm sitz bath, warm packs or heating pad may be used for pain relief, taking care not to burn yourself.   Return for a wound check at next scheduled appt   An "inflatable doughnut" may be used for sitting with added comfort. These can be purchased at a drugstore or medical supply house.   To reduce pain and straining with bowel movements, eat a high fiber diet with plenty of fruits and vegetables. Use stool softeners as recommended by your caregiver. This is especially important if narcotic type pain medications were prescribed as these may cause marked constipation.   Only take over-the-counter or prescription medicines for pain, discomfort, or fever as directed by your caregiver.  SEEK IMMEDIATE MEDICAL CARE IF:  You have increasing pain that is not controlled by medication.   There is increased inflammation (redness), swelling, bleeding, or drainage from the area.   An oral temperature above 101.5 develops.   You develop chills or generalized malaise (feel lethargic or feel "washed out").   You develop any new symptoms (problems) you feel may be related to your present problem.  Document Released: 05/28/2000 Document Re-Released: 10/17/2008 [facility] Patient Information  2011 [place], [place].

## 2010-12-30 ENCOUNTER — Encounter (INDEPENDENT_AMBULATORY_CARE_PROVIDER_SITE_OTHER): Payer: Self-pay | Admitting: General Surgery

## 2010-12-30 ENCOUNTER — Ambulatory Visit (INDEPENDENT_AMBULATORY_CARE_PROVIDER_SITE_OTHER): Payer: Managed Care, Other (non HMO) | Admitting: General Surgery

## 2010-12-30 DIAGNOSIS — K6139 Other ischiorectal abscess: Secondary | ICD-10-CM

## 2010-12-30 DIAGNOSIS — K612 Anorectal abscess: Secondary | ICD-10-CM

## 2010-12-30 MED ORDER — TRAMADOL HCL 50 MG PO TABS: 50.0000 mg | ORAL_TABLET | Freq: Four times a day (QID) | ORAL | Status: DC | PRN

## 2010-12-30 MED ORDER — AMOXICILLIN-POT CLAVULANATE 875-125 MG PO TABS: 1.0000 | ORAL_TABLET | Freq: Two times a day (BID) | ORAL | Status: AC

## 2010-12-30 NOTE — Progress Notes (Signed)
Procedure: Exam under anesthesia, incision and drainage of right anterior ischio rectal abscess may 25th 2012  History of Present Ilness:  Jack Smith comes in today for another wound check. I last saw one week ago. At that time, the skin incision for the right ischiorectal abscess had closed. We reopened the incision last week in the office. He complains of some itching around the wound. He also complains of more discomfort in that area. He denies any fevers or chills. The wound is currently draining. He describes the drainage as reddish yellow. He is having to change the pad in his underwear once or twice a day. He reports daily bowel movements.  Physical Exam: Well-developed well-nourished African American male in no apparent distress Rectal-1 inch right posterior lateral incision, no cellulitis. The wound is currently draining seropurulent fluid. I'm able to probe wound with a Q-tip applicator. There is still a cavity extending posterior. He is tender to palpation in that area. There is a seton in the posterior midline without any surrounding inflammation.    Assessment and Plan: Status post incision and drainage of a right posterior lateral ischio rectal abscess.  The wound is open and currently draining. Right now, I do not see the need to open up the skin incision any further. I encouraged him to continue with sitz baths. I placed him on Augmentin 875 twice a day for 10 days and gave him a prescription for Ultram. I'll see him back in one week. He was instructed to call for worsening problems.

## 2010-12-30 NOTE — Patient Instructions (Signed)
Peri-Rectal Abscess (Abscess Around the Rectum) Your caregiver has diagnosed you as having a peri-rectal abscess. This is an infected area near the rectum that is filled with pus. If the abscess is near the surface of the skin, your caregiver may open (incise) the area and drain the pus. HOME CARE INSTRUCTIONS  If your abscess was opened up and drained.   A loose dressing may be placed over the abscess site. Change the dressing as often as necessary to keep it clean and dry.   After the drain is removed, the area may be washed with a gentle antiseptic (soap) four times per day.   A warm sitz bath, warm packs or heating pad may be used for pain relief, taking care not to burn yourself.   Return for a wound check in 1 week.   An "inflatable doughnut" may be used for sitting with added comfort. These can be purchased at a drugstore or medical supply house.   To reduce pain and straining with bowel movements, eat a high fiber diet with plenty of fruits and vegetables. Use stool softeners as recommended by your caregiver. This is especially important if narcotic type pain medications were prescribed as these may cause marked constipation.   Only take over-the-counter or prescription medicines for pain, discomfort, or fever as directed by your caregiver.  SEEK IMMEDIATE MEDICAL CARE IF:  You have increasing pain that is not controlled by medication.   There is increased inflammation (redness), swelling, bleeding, or drainage from the area.   An oral temperature above 101 develops.   You develop chills or generalized malaise (feel lethargic or feel "washed out").   You develop any new symptoms (problems) you feel may be related to your present problem.  Document Released: 05/28/2000 Document Re-Released: 10/17/2008 [facility] Patient Information 2011 [place], [place].

## 2011-01-04 ENCOUNTER — Ambulatory Visit (INDEPENDENT_AMBULATORY_CARE_PROVIDER_SITE_OTHER): Payer: Managed Care, Other (non HMO) | Admitting: General Surgery

## 2011-01-04 ENCOUNTER — Encounter (INDEPENDENT_AMBULATORY_CARE_PROVIDER_SITE_OTHER): Payer: Self-pay | Admitting: General Surgery

## 2011-01-04 DIAGNOSIS — K6139 Other ischiorectal abscess: Secondary | ICD-10-CM

## 2011-01-04 NOTE — Progress Notes (Signed)
Cc: followup  Procedure: Exam under anesthesia, incision and drainage of right anterior ischiorectal abscess Nov 06, 2010  History of Present Ilness: 20 year old [place] American male comes in for followup. I last saw him last week. The skin incision over his right ischiorectal abscess had closed up requiring [place] to reopen the skin incision. He had a lot of pain in his right buttock last week. He placed him on Augmentin and gave him some Ultram. He states that his pain is resolved. He denies any additional drainage. He reports daily bowel movements. He denies any fevers or chills.  PMH, PSH, SOCH, FamHx reviewed & unchanged.  Physical Exam: There were no vitals taken for this visit.  Well-developed nourished African American male in no apparent distress Rectal-oblique incision in the right medial buttock. No cellulitis or induration. No fluctuance. Skin incision is about 1 inch in length. Nontender to palpation. There is a seton in the posterior midline    Assessment and Plan: Status post incision and drainage of the right anterior ischiorectal abscess. The patient is currently heading in the right direction. It appears this abscess it is starting to resolve. I will see him back in 6 weeks' time. At that time we'll discuss management of the seton such as fistula plug versus possible fistulotomy.

## 2011-02-11 ENCOUNTER — Encounter (INDEPENDENT_AMBULATORY_CARE_PROVIDER_SITE_OTHER): Payer: Self-pay | Admitting: General Surgery

## 2011-02-11 ENCOUNTER — Ambulatory Visit (INDEPENDENT_AMBULATORY_CARE_PROVIDER_SITE_OTHER): Payer: Managed Care, Other (non HMO) | Admitting: General Surgery

## 2011-02-11 VITALS — BP 122/78 | HR 68

## 2011-02-11 DIAGNOSIS — K603 Anal fistula: Secondary | ICD-10-CM

## 2011-02-11 NOTE — Progress Notes (Signed)
Chief Complaint  Patient presents with  . Other    6 week recheck     HPI Jack Smith is a 20 y.o. male.   HPI  20 year old [place] American male comes in for follow-up. I last saw him in mid July. Since that time he's done quite well. He denies any fevers or chills. He denies any peri- rectal discomfort. He denies any pain with defecation. He denies any diarrhea or constipation. He denies any incontinence. He denies any dysuria. He is interested in proceeding with definitive management for the fistula in ano.  Past medical, surgical, family, and social histories are reviewed and unchanged.  He has returned to school  Past Medical History  Diagnosis Date  . Asthma   . Anal condyloma   . Anal fissure   . Fistula-in-ano   . Abscess     Past Surgical History  Procedure Date  . Exam under anesthesia with drainage of abscess 11/06/10    Dr Gaynelle Adu, right ischiorectal abscess  . Condyloma excision/fulguration 04/09/10    anal canal  . Condyloma excision/fulguration 10/11/09    placement of L posterolateral transphincteric seton; reexcision of anal canal warts    Family History  Problem Relation Age of Onset  . Hypertension Father     Social History History  Substance Use Topics  . Smoking status: Never Smoker   . Smokeless tobacco: Not on file  . Alcohol Use: Yes     "occasionally"    No Known Allergies    Review of Systems Review of Systems  Constitutional: Positive for chills. Negative for fever and activity change.  HENT: Negative for sneezing and neck pain.   Eyes: Negative for pain and discharge.  Respiratory: Negative for chest tightness and shortness of breath.   Cardiovascular: Negative for chest pain.  Gastrointestinal: Negative for nausea, diarrhea, constipation, blood in stool and abdominal distention.  Genitourinary: Negative for dysuria, difficulty urinating and genital sores.  Musculoskeletal: Negative.   Skin: Negative.   Neurological:  Negative.   Hematological: Negative.   Psychiatric/Behavioral: Negative.     Blood pressure 122/78, pulse 68.  Physical Exam Physical Exam  Vitals reviewed. Constitutional: He is oriented to person, place, and time. He appears well-developed and well-nourished. No distress.  HENT:  Head: Normocephalic and atraumatic.  Eyes: Conjunctivae are normal. No scleral icterus.  Neck: Normal range of motion. Neck supple. No tracheal deviation present.  Cardiovascular: Normal rate, regular rhythm and normal heart sounds.   Pulmonary/Chest: Effort normal and breath sounds normal. No respiratory distress.  Abdominal: Soft. Bowel sounds are normal. He exhibits no distension. There is no tenderness.  Genitourinary:       Well healed Rt sided ischiorectal abscess-no induration, no cellutis.    L posterolateral seton in place. Appears to be more superficial. No evidence of fissure  Musculoskeletal: Normal range of motion.  Lymphadenopathy:    He has no cervical adenopathy.  Neurological: He is alert and oriented to person, place, and time.  Skin: Skin is warm and dry.  Psychiatric: He has a normal mood and affect. His behavior is normal. Judgment and thought content normal.    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 It appears that the right ischiorectal abscess has resolved. It also  appears that his fissure has resolved as well. Therefore I am comfortable with taking him to the operating room for management for that fistula in ano.  We discussed delaying surgery for several months versus proceeding to the operating room. The patient has elected to proceed to the operating room. We discussed several procedures such as fistulotomy versus fistula plug placement. I recommended that if the fistula  appears superficial that we proceed with the fistulotomy. If it appears that the fistula is deeper than we place a fistula plug. We discussed that I  really wouldnot  be able to make a decision regarding which procedure to do until we get into the operating room. We discussed the risk and benefits of surgery including but not limited to bleeding, infection, urinary retention, blood clot formation, need for additional procedures, failure of the fistula plug to resolve the fistula, postoperative pain, and injury to the sphincter muscle resulting in incontinence.  He will do an enema the morning of surgery. We'll plan to do an exam under anesthesia with fistulotomy versus fistula plug placement in the near future.     [name]. [name], MD    Gaynelle Adu Judie Petit 02/11/2011, 11:05 AM

## 2011-02-11 NOTE — Patient Instructions (Signed)
Use one fleets enema per rectum the AM of surgery atleast 2 hours prior to leaving the house.

## 2011-02-18 ENCOUNTER — Encounter (HOSPITAL_COMMUNITY)
Admission: RE | Admit: 2011-02-18 | Discharge: 2011-02-18 | Disposition: A | Discharge status: Home / Self Care | Payer: Managed Care, Other (non HMO) | Source: Ambulatory Visit | Attending: General Surgery | Admitting: General Surgery

## 2011-02-18 LAB — CBC
HCT: 43.7 % (ref 39.0–52.0)
MCH: 31.4 pg (ref 26.0–34.0)
MCV: 86.4 fL (ref 78.0–100.0)
RBC: 5.06 MIL/uL (ref 4.22–5.81)
WBC: 3 10*3/uL — ABNORMAL LOW (ref 4.0–10.5)

## 2011-02-19 ENCOUNTER — Telehealth (INDEPENDENT_AMBULATORY_CARE_PROVIDER_SITE_OTHER): Payer: Self-pay | Admitting: General Surgery

## 2011-02-19 NOTE — Telephone Encounter (Signed)
Labs okay for surgery faxed to pre-op.  

## 2011-02-19 NOTE — Telephone Encounter (Signed)
Message copied by Liliana Cline on Fri Feb 19, 2011 10:11 AM ------      Message from: [name], ERIC M      Created: Fri Feb 19, 2011 10:00 AM       Labs ok for surgery

## 2011-02-22 ENCOUNTER — Ambulatory Visit (HOSPITAL_COMMUNITY)
Admission: RE | Admit: 2011-02-22 | Discharge: 2011-02-22 | Disposition: A | Discharge status: Home / Self Care | Payer: Managed Care, Other (non HMO) | Source: Ambulatory Visit | Attending: General Surgery | Admitting: General Surgery

## 2011-02-22 DIAGNOSIS — K603 Anal fistula, unspecified: Secondary | ICD-10-CM | POA: Insufficient documentation

## 2011-02-22 DIAGNOSIS — Z01812 Encounter for preprocedural laboratory examination: Secondary | ICD-10-CM | POA: Insufficient documentation

## 2011-02-22 HISTORY — PX: OTHER SURGICAL HISTORY: SHX169

## 2011-02-23 ENCOUNTER — Encounter (INDEPENDENT_AMBULATORY_CARE_PROVIDER_SITE_OTHER): Payer: Self-pay | Admitting: General Surgery

## 2011-02-25 ENCOUNTER — Encounter (INDEPENDENT_AMBULATORY_CARE_PROVIDER_SITE_OTHER): Payer: Self-pay | Admitting: General Surgery

## 2011-02-26 NOTE — Op Note (Signed)
NAME:  Jack Smith, Jack Smith                ACCOUNT NO.:  [id number]  MEDICAL RECORD NO.:  [id number]  LOCATION:  [facility]                         FACILITY:  [facility]  PHYSICIAN:  [name]. 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  PROCEDURE: 1. Exam under anesthesia. 2. Fistulotomy.  ANESTHESIA:  General plus local consisting of 0.5% Marcaine with epi followed by 30 mL Exparel.  ESTIMATED BLOOD LOSS:  Minimal.  FINDINGS:  There is no evidence of anal canal wart recurrence or perianal wart recurrence.  There is no signs of active infection.  The fistula was essentially in the posterior midline, slightly to the left. It was a short tract fistula about half a centimeter in length.  It appeared to be intersphincteric, therefore I elected to do a fistulotomy.  INDICATIONS FOR PROCEDURE:  Jack Smith is a very pleasant 20 year old [place] American male who I initially met last fall.  At that time, he had extensive circumferential anal canal condyloma acuminata.  He underwent excision and fulguration on April 09, 2010.  Several months later, it was noted that he had a recurrence of his anal canal warts.  He also developed a fistula-in-ano in the posterior midline.  He had [place] around [place] for perirectal pain and perianal abscess. We took him back to the operating room on April 30 for a re- excision of his anal canal warts as well as the placement of a Seton. He recovered for several weeks, however, he developed a very large right ischiorectal abscess that required drainage in the operating room on May 25.  The Burnadette Pop was left in place.  His subsequent abscess has resolved. There has been no evidence of recurrence of  warts and the Burnadette Pop has been in place since the end of April.  Therefore, he desired definitive surgical management for a fistula-in-ano.  We discussed several potential options such as a fistulotomy versus fistula plug placement. I did not think he would be good candidate for an endorectal advancement flap.  We discussed the risks and benefits of surgery including but not limited to bleeding, infection, abscess, failure to heal the fistula, urinary retention, blood clot formation, general anesthesia risk as well as postoperative pain and discomfort.  He elected to proceed to the operating room.  DESCRIPTION OF PROCEDURE:  The patient was taken to the operating room. General endotracheal anesthesia was established.  He was then placed in the prone jackknife position with the appropriate padding.  Sequential compression devices had already been placed.  He received cefoxitin prior to skin incision.  His buttocks were taped apart and his perianal and buttock area was prepped with Betadine.  A surgical time-out was performed.  On gross inspection, the Burnadette Pop was essentially in the posterior midline. There was no evidence of perianal warts.  Digital rectal exam was performed, which demonstrated no areas of palpable induration or fluctuance.  Anoscopy was then performed, which demonstrated no  evidence of recurrence of warts.  In the posterior midline, the Seton was visible.  The tract of the fistula appeared to be short.  Using a 20 mL syringe with hydrogen peroxide and a 16-gauge Angiocath sheath.  I injected the tract through the external opening.  The hydrogen peroxide came out through one orifice.  There was no evidence of secondary tract extension.  The tract appeared to be intersphincteric in a short length of around 0.5 cm.  The mucosa on the inside around was somewhat hypertrophied, but there was no evidence of fluctuance or signs of infection.  One of my partners took a look, Dr.  Johna Sheriff.  With the short length of the fistula tract,  I did not believe the fistula plug was indicated.  Therefore, I decided to perform a fistulotomy.  Using a lacrimal duct probe, I advanced the probe through the fistula tract. I then cut and removed draining Seton.  I injected local with epi around the area to help with hemostasis.  Then using Bovie electrocautery, I cut through the skin and a short segment of the external sphincter of about 3 mm with electrocautery.  The tract had been opened up.  Then using a curette, I curetted the tract.  Hemostasis was achieved.  At this point, I injected the Exparel in a regional fashion..  I then applied dibucaine ointment around the perineum.  A 4x4s, fluffs, the mesh underwear were placed.  The patient was then rolled into the supine position, extubated and taken to recovery room in stable addition.  All needle, instrument, sponge counts were correct x2.  There are no immediate complications.  The patient tolerated the procedure well.     [name]. [name], MD     EMW/MEDQ  D:  02/22/2011  T:  02/22/2011  Job:  [id number]  Electronically Signed by Gaynelle Adu M.D. on 02/26/2011 08:52:28 AM

## 2011-03-01 ENCOUNTER — Encounter (INDEPENDENT_AMBULATORY_CARE_PROVIDER_SITE_OTHER): Payer: Self-pay | Admitting: General Surgery

## 2011-03-19 ENCOUNTER — Ambulatory Visit (INDEPENDENT_AMBULATORY_CARE_PROVIDER_SITE_OTHER): Payer: Managed Care, Other (non HMO) | Admitting: General Surgery

## 2011-03-19 ENCOUNTER — Encounter (INDEPENDENT_AMBULATORY_CARE_PROVIDER_SITE_OTHER): Payer: Self-pay | Admitting: General Surgery

## 2011-03-19 VITALS — BP 142/80 | HR 78 | Temp 98.2°F | Resp 16 | Ht 73.0 in | Wt 165.2 lb

## 2011-03-19 DIAGNOSIS — Z09 Encounter for follow-up examination after completed treatment for conditions other than malignant neoplasm: Secondary | ICD-10-CM

## 2011-03-19 NOTE — Progress Notes (Signed)
Chief complaint: Postop  Procedure: Status post exam under anesthesia, fistulotomy February 22, 2011  History of Present Ilness: 20 year old [place] American male comes in today for his first postoperative appointment. He states that he is doing great. He denies any pain. He denies any fevers or chills. He denies any trouble urinating. He denies any diarrhea, constipation, melena, hematochezia, or pain with defecation. He will still have some occasional drainage in his underwear but it is very little  Physical Exam: BP 142/80  Pulse 78  Temp(Src) 98.2 F (36.8 C) (Temporal)  Resp 16  Ht 6\' 1"  (1.854 m)  Wt 165 lb 3.2 oz (74.934 kg)  BMI 21.80 kg/m2  Well-developed well-nourished African American male in no apparent distress Pulmonary-lungs are clear Cardiac-regular rate and rhythm Rectal-no cellulitis, induration, or fluctuance. Healing wound in the posterior midline. There is about a half a centimeter gap between the mucosal edges in the posterior midline. It is only about 2 mm deep   Assessment and Plan: Status post exam under anesthesia, fistulotomy for fistula in ano  The patient is doing great. Obviously his incision is still healing. I release him to full activities. I will see him in 6 weeks. At that time we'll do anoscopy and digital rectal exam  [name]. [name], MD, FACS

## 2011-04-30 ENCOUNTER — Ambulatory Visit (INDEPENDENT_AMBULATORY_CARE_PROVIDER_SITE_OTHER): Payer: Managed Care, Other (non HMO) | Admitting: General Surgery

## 2011-04-30 ENCOUNTER — Encounter (INDEPENDENT_AMBULATORY_CARE_PROVIDER_SITE_OTHER): Payer: Self-pay | Admitting: General Surgery

## 2011-04-30 VITALS — BP 148/88 | HR 64 | Temp 97.2°F | Resp 16 | Ht 73.0 in | Wt 170.0 lb

## 2011-04-30 DIAGNOSIS — Z09 Encounter for follow-up examination after completed treatment for conditions other than malignant neoplasm: Secondary | ICD-10-CM

## 2011-04-30 NOTE — Progress Notes (Signed)
Chief complaint: Postop  Procedure: Status post exam under anesthesia, fistulotomy February 22, 2011  History of Present Ilness: 20 year old [place] American male comes in today for his second postoperative appointment. He states that he is doing great. He denies any pain. He denies any fevers or chills. He denies any trouble urinating. He denies any diarrhea, constipation, melena, hematochezia, or pain with defecation. He will still have some occasional drainage in his underwear but it is very little.  He thinks he may have a hemorrhoid flare the other week.   Physical Exam: BP 148/88  Pulse 64  Temp(Src) 97.2 F (36.2 C) (Temporal)  Resp 16  Ht 6\' 1"  (1.854 m)  Wt 170 lb (77.111 kg)  BMI 22.43 kg/m2  Well-developed well-nourished African American male in no apparent distress Pulmonary-lungs are clear Cardiac-regular rate and rhythm Rectal-no cellulitis, induration, or fluctuance. The posterior midline incision is essentially closed. On the inside of the anus, there is still a [place] separation of mucosa about 2 mm in length. Some left sided nonthrombosed ext hemorrhoidal tissue.   Assessment and Plan: Status post exam under anesthesia, fistulotomy for fistula in ano  The patient is doing great. His incision is still healing. I will see him in 6 weeks. At that time we'll do anoscopy and digital rectal exam  [name]. [name], MD, FACS

## 2011-04-30 NOTE — Patient Instructions (Signed)
Fiber Content in Foods Drinking plenty of fluids and consuming foods high in fiber can help with constipation. See the list below for the fiber content of some common foods. Starches and Grains / Dietary Fiber (g)  Cheerios, 1 cup / 3 g   Kellogg's Corn Flakes, 1 cup / 0.7 g   Rice Krispies, 1  cup / 0.3 g   Quaker Oat Life Cereal,  cup / 2.1 g   Oatmeal, instant (cooked),  cup / 2 g   Kellogg's Frosted Mini Wheats, 1 cup / 5.1 g   Rice, brown, long-grain (cooked), 1 cup / 3.5 g   Rice, white, long-grain (cooked), 1 cup / 0.6 g   Macaroni, cooked, enriched, 1 cup / 2.5 g  Legumes / Dietary Fiber (g)  Beans, baked, canned, plain or vegetarian,  cup / 5.2 g   Beans, kidney, canned,  cup / 6.8 g   Beans, pinto, dried (cooked),  cup / 7.7 g   Beans, pinto, canned,  cup / 5.5 g  Breads and Crackers / Dietary Fiber (g)  Graham crackers, plain or honey, 2 squares / 0.7 g   Saltine crackers, 3 squares / 0.3 g   Pretzels, plain, salted, 10 pieces / 1.8 g   Bread, whole-wheat, 1 slice / 1.9 g   Bread, white, 1 slice / 0.7 g   Bread, raisin, 1 slice / 1.2 g   Bagel, plain, 3 oz / 2 g   Tortilla, flour, 1 oz / 0.9 g   Tortilla, corn, 1 [place] / 1.5 g   Bun, hamburger or hotdog, 1 Ress / 0.9 g  Fruits / Dietary Fiber (g)  Apple, raw with skin, 1 medium / 4.4 g   Applesauce, sweetened,  cup / 1.5 g   Banana,  medium / 1.5 g   Grapes, 10 grapes / 0.4 g   Orange, 1 [place] / 2.3 g   Raisin, 1.5 oz / 1.6 g   Melon, 1 cup / 1.4 g  Vegetables / Dietary Fiber (g)  Green beans, canned,  cup / 1.3 g   Carrots (cooked),  cup / 2.3 g   Broccoli (cooked),  cup / 2.8 g   Peas, frozen (cooked),  cup / 4.4 g   Potatoes, mashed,  cup / 1.6 g   Lettuce, 1 cup / 0.5 g   Corn, canned,  cup / 1.6 g   Tomato,  cup / 1.1 g  Document Released: 10/17/2006 Document Revised: 02/10/2011 Document Reviewed: 12/12/2006 [facility] Patient Information 2012  [place], [place].

## 2011-06-23 ENCOUNTER — Encounter (INDEPENDENT_AMBULATORY_CARE_PROVIDER_SITE_OTHER): Payer: Self-pay | Admitting: General Surgery

## 2011-06-23 ENCOUNTER — Ambulatory Visit (INDEPENDENT_AMBULATORY_CARE_PROVIDER_SITE_OTHER): Payer: Managed Care, Other (non HMO) | Admitting: General Surgery

## 2011-06-23 VITALS — BP 98/68 | HR 68 | Resp 12 | Ht 73.0 in | Wt 172.0 lb

## 2011-06-23 DIAGNOSIS — Z09 Encounter for follow-up examination after completed treatment for conditions other than malignant neoplasm: Secondary | ICD-10-CM

## 2011-06-23 NOTE — Progress Notes (Signed)
Chief complaint: Postop  Procedure: Status post exam under anesthesia, fistulotomy February 22, 2011  History of Present Ilness: 21 year old [place] American male comes in today for his third postoperative appointment. He states that he is doing great. He denies any pain. He denies any fevers or chills. He denies any trouble urinating. He denies any diarrhea, constipation, melena, hematochezia, or pain with defecation. He no longer has drainage in his underwear.  He denies any pain with defecation.   Physical Exam: BP 98/68  Pulse 68  Resp 12  Ht 6\' 1"  (1.854 m)  Wt 172 lb (78.019 kg)  BMI 22.69 kg/m2  Well-developed well-nourished African American male in no apparent distress Pulmonary-lungs are clear Cardiac-regular rate and rhythm Rectal-no cellulitis, induration, or fluctuance. The posterior midline incision is closed.  Some minimal left sided nonthrombosed ext hemorrhoidal tissue. DRE-good tone. No masses. Anoscopy- no evid of new warts, no lesions. No internal hemorrhoids  Assessment and Plan: Status post exam under anesthesia, fistulotomy for fistula in ano  The patient is doing great. We discussed good bowel habits and the pt was given educational material.  F/u 6 months  [name]. [name], MD, FACS

## 2011-06-23 NOTE — Patient Instructions (Signed)

## 2011-06-30 ENCOUNTER — Encounter (INDEPENDENT_AMBULATORY_CARE_PROVIDER_SITE_OTHER): Payer: Self-pay

## 2011-06-30 ENCOUNTER — Other Ambulatory Visit (INDEPENDENT_AMBULATORY_CARE_PROVIDER_SITE_OTHER): Payer: Self-pay | Admitting: General Surgery

## 2011-06-30 ENCOUNTER — Ambulatory Visit (INDEPENDENT_AMBULATORY_CARE_PROVIDER_SITE_OTHER): Payer: Managed Care, Other (non HMO) | Admitting: General Surgery

## 2011-06-30 ENCOUNTER — Encounter (INDEPENDENT_AMBULATORY_CARE_PROVIDER_SITE_OTHER): Payer: Self-pay | Admitting: General Surgery

## 2011-06-30 VITALS — BP 128/72 | HR 74 | Temp 97.9°F | Resp 16 | Ht 73.0 in | Wt 167.8 lb

## 2011-06-30 DIAGNOSIS — K610 Anal abscess: Secondary | ICD-10-CM

## 2011-06-30 DIAGNOSIS — K612 Anorectal abscess: Secondary | ICD-10-CM

## 2011-06-30 NOTE — Progress Notes (Signed)
Subjective:     Patient ID: Jack Smith, male   DOB: 06-24-1990, 21 y.o.   MRN: [id number]  HPI This patient is known to our practice for history of multiple buttock and perirectal abscesses as well as recent treatment for anal fistula status post anal fistulotomy. He presents today with recurrent swelling and and tenderness in the area of his right buttocks near his anal region. He recently received some antibiotics by his primary physician as well and has been taking this for 2 days. Originally he had some low-grade temperatures of 100.3 but nothing recently. Review of Systems     Objective:   Physical Exam No distress and nontoxic-appearing  He has some tenderness and a [place] area of fluctuance in the right buttock near the perianal region. There is no erythema or cellulitis. I aspirated this area of fluctuance with an 18-gauge needle and aspirated 6 cc of blood-tinged purulent material which was thin and evacuated easy. This took care of the fluctuance.    Assessment:     Right buttock abscess  I discussed with him the options for treatment including incision and drainage versus aspiration and antibiotics and I explained that the standard treatment for this would be formal incision and drainage. However, it is by himself and would have a difficult time caring for this wound and so he desired needle aspiration and to continue on antibiotics which aren't been prescribed to him and he will followup in one week for repeat evaluation if this does not improve or he has recurrent swelling and tenderness and he understands that he will need to let us know and followup soon for formal incision and drainage.Hopefully, given his history of prior anal fistula, if the needle aspiration is successful for treatment, he may be able to avoid recurrent anal fistula which I think would be higher risk with formal incision and drainage.    Plan:     He will continue on Bactrim as already prescribed to him he  will followup within a week for repeat evaluation. He agreed to call us or go temperature for any increased swelling and tenderness and fevers.

## 2011-06-30 NOTE — Telephone Encounter (Signed)
This encounter was created in error - please disregard.

## 2011-07-03 LAB — WOUND CULTURE
Gram Stain: NONE SEEN
Gram Stain: NONE SEEN
Organism ID, Bacteria: NORMAL

## 2011-07-07 ENCOUNTER — Encounter (INDEPENDENT_AMBULATORY_CARE_PROVIDER_SITE_OTHER): Payer: Self-pay | Admitting: General Surgery

## 2011-07-07 ENCOUNTER — Ambulatory Visit (INDEPENDENT_AMBULATORY_CARE_PROVIDER_SITE_OTHER): Payer: Managed Care, Other (non HMO) | Admitting: General Surgery

## 2011-07-07 VITALS — BP 126/74 | HR 76 | Temp 98.6°F | Resp 16 | Ht 73.0 in | Wt 172.8 lb

## 2011-07-07 DIAGNOSIS — K612 Anorectal abscess: Secondary | ICD-10-CM

## 2011-07-07 DIAGNOSIS — K611 Rectal abscess: Secondary | ICD-10-CM | POA: Insufficient documentation

## 2011-07-07 MED ORDER — SULFAMETHOXAZOLE-TMP DS 800-160 MG PO TABS: 1.0000 | ORAL_TABLET | Freq: Two times a day (BID) | ORAL | Status: DC

## 2011-07-07 NOTE — Patient Instructions (Signed)
Finish up antibiotics  Continue warm tub soaks.

## 2011-07-07 NOTE — Progress Notes (Signed)
Subjective:     Patient ID: Jack Smith, male   DOB: [birth year], 21 y.o.   MRN: [id number]  HPI  21 year old [place] American male comes in for followup. I had just seen Jack Alexanders on January 9 at which time he was doing great. There was no signs of active disease or abscesses. 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 He has previously undergone an I & D of a right ischiorectal abscess in May 2012. He apparently saw Dr. Biagio Quint on January 16 for recurrent right-sided perirectal pain and discomfort. He had already been placed on antibiotics. He underwent needle aspiration of a proximally 6 cc of blood tinged purulent fluid.  He states that since he was last seen, the area has ruptured. It started spontaneously draining several days ago. He also states last week he developed an ear infection. He denies any fevers or chills. He denies any diarrhea or constipation. He denies any incontinence. He is still having a little bit of intermittent drainage. The perirectal pain has improved dramatically. He just has a little bit of discomfort with bowel movements.  PMH, PSH, All, MED, SOCH, FAMHx reviewed  Review of Systems See above     Objective:   Physical Exam BP 126/74  Pulse 76  Temp(Src) 98.6 F (37 C) (Temporal)  Resp 16  Ht 6\' 1"  (1.854 m)  Wt 172 lb 12.8 oz (78.382 kg)  BMI 22.80 kg/m2  BP 126/74  Pulse 76  Temp(Src) 98.6 F (37 C) (Temporal)  Resp 16  Ht 6\' 1"  (1.854 m)  Wt 172 lb 12.8 oz (78.382 kg)  BMI 22.80 kg/m2  Gen: alert, NAD, non-toxic appearing Pulm: Lungs clear to auscultation, symmetric chest rise CV: regular rate and rhythm Abd: soft, nontender, nondistended.  GU: [place] 0.5cm open wound in RT anteriorlateral position about 2 in from anus. No cellulitis. No fluctuance. Very mild induration around opening. Tracks subcu about 2cm posteriorly. No drainage. DRE- no palpable masses or fluctuance Ext:  no edema     Assessment:     Recurrent Right perirectal abscess    Plan:     There does not appear to be any undrained pocket of pus. The cavity does track for several centimeters. This is in the same location where his previous incision and drainage of his right ischiorectal abscess was performed last May. I have advised him to continue with antibiotics. He was given a refill on his Bactrim-mainly for the Brow amount of ongoing induration.  This is very frustrating that he has had a recurrence. I am unsure of why this has occurred more than 6 months since his incision and drainage. It is possible he may be developing a fistula. Since he has had such a complex rectal history I have recommended that we continue conservative management for now. If he has ongoing problems such as persistence of this wound he may need an MRI of his pelvis and/or exam under anesthesia to rule out a fistula.  Followup 2-3 weeks  [name]. [name], MD, FACS General, Bariatric, & Minimally Invasive Surgery [facility], [place]

## 2011-07-26 ENCOUNTER — Encounter (INDEPENDENT_AMBULATORY_CARE_PROVIDER_SITE_OTHER): Payer: Managed Care, Other (non HMO) | Admitting: General Surgery

## 2011-07-27 ENCOUNTER — Telehealth (INDEPENDENT_AMBULATORY_CARE_PROVIDER_SITE_OTHER): Payer: Self-pay | Admitting: General Surgery

## 2011-07-27 NOTE — Telephone Encounter (Signed)
Missed appt yesterday. Needs a follow up appt. Finished with antibiotics and wound area still swollen and sore. Appt made for Friday 07/30/11 @ 2:30pm with Dr [name].

## 2011-07-30 ENCOUNTER — Encounter (INDEPENDENT_AMBULATORY_CARE_PROVIDER_SITE_OTHER): Payer: Self-pay | Admitting: General Surgery

## 2011-07-30 ENCOUNTER — Ambulatory Visit (INDEPENDENT_AMBULATORY_CARE_PROVIDER_SITE_OTHER): Payer: Managed Care, Other (non HMO) | Admitting: General Surgery

## 2011-07-30 VITALS — BP 122/78 | HR 76 | Temp 98.2°F | Resp 16 | Ht 73.0 in | Wt 170.0 lb

## 2011-07-30 DIAGNOSIS — K612 Anorectal abscess: Secondary | ICD-10-CM

## 2011-07-30 DIAGNOSIS — K611 Rectal abscess: Secondary | ICD-10-CM

## 2011-07-30 MED ORDER — DOXYCYCLINE HYCLATE 100 MG PO TABS: 100.0000 mg | ORAL_TABLET | Freq: Two times a day (BID) | ORAL | Status: AC

## 2011-07-30 MED ORDER — OXYCODONE HCL 5 MG PO TABS: 5.0000 mg | ORAL_TABLET | ORAL | Status: AC | PRN

## 2011-07-30 NOTE — Progress Notes (Deleted)
Subjective:     Patient ID: Jack Smith, male   DOB: 1991-03-19, 21 y.o.   MRN: [id number]  HPI  21 year old [place] male comes in for followup. I had just seen Jack Alexanders on January 9 at which time he was doing great. There was no signs of active disease or abscesses. 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 He has previously undergone an I & D of a right ischiorectal abscess in May 2012. He apparently saw Dr. Biagio Quint on January 16 for recurrent right-sided perirectal pain and discomfort. He had already been placed on antibiotics. He underwent needle aspiration of a proximally 6 cc of blood tinged purulent fluid.  He states that since he was last seen, the area has ruptured. It started spontaneously draining several days ago. He also states last week he developed an ear infection. He denies any fevers or chills. He denies any diarrhea or constipation. He denies any incontinence. He is still having a little bit of intermittent drainage. The perirectal pain has improved dramatically. He just has a little bit of discomfort with bowel movements.  PMH, PSH, All, MED, SOCH, FAMHx reviewed  Review of Systems See above     Objective:   Physical Exam BP 122/78  Pulse 76  Temp(Src) 98.2 F (36.8 C) (Temporal)  Resp 16  Ht 6\' 1"  (1.854 m)  Wt 170 lb (77.111 kg)  BMI 22.43 kg/m2  BP 122/78  Pulse 76  Temp(Src) 98.2 F (36.8 C) (Temporal)  Resp 16  Ht 6\' 1"  (1.854 m)  Wt 170 lb (77.111 kg)  BMI 22.43 kg/m2  Gen: alert, NAD, non-toxic appearing Pulm: Lungs clear to auscultation, symmetric chest rise CV: regular rate and rhythm Abd: soft, nontender, nondistended.  GU: [place] 0.5cm open wound in RT anteriorlateral position about 2 in from anus. No cellulitis. No fluctuance. Very mild induration around opening. Tracks subcu about 2cm posteriorly. No drainage. DRE- no palpable masses or fluctuance Ext: no  edema     Assessment:     Recurrent Right perirectal abscess    Plan:     There does not appear to be any undrained pocket of pus. The cavity does track for several centimeters. This is in the same location where his previous incision and drainage of his right ischiorectal abscess was performed last May. I have advised him to continue with antibiotics. He was given a refill on his Bactrim-mainly for the [place] amount of ongoing induration.  This is very frustrating that he has had a recurrence. I am unsure of why this has occurred more than 6 months since his incision and drainage. It is possible he may be developing a fistula. Since he has had such a complex rectal history I have recommended that we continue conservative management for now. If he has ongoing problems such as persistence of this wound he may need an MRI of his pelvis and/or exam under anesthesia to rule out a fistula.  Followup 2-3 weeks  [name]. [name], MD, FACS General, Bariatric, & Minimally Invasive Surgery [facility] Surgery, [place]

## 2011-07-30 NOTE — Patient Instructions (Signed)
Abscess Care After   HOME CARE INSTRUCTIONS   Only take over-the-counter or prescription medicines for pain, discomfort, or fever as directed by your caregiver.   When you bathe, soak and then remove gauze or iodoform packs. You may then wash the wound gently with mild soapy water. Cover with gauze or do as your caregiver directs. \  Take warm water bathes 2-3 times/day for several days- it will help with the discomfort.  SEEK IMMEDIATE MEDICAL CARE IF:   You develop increased pain, swelling, redness, drainage, or bleeding in the wound site.   You develop signs of generalized infection including muscle aches, chills, fever, or a general ill feeling.   An oral temperature above 102 F (38.9 C) develops, not controlled by medication.  See your caregiver for a recheck if you develop any of the symptoms described above. If medications (antibiotics) were prescribed, take them as directed.

## 2011-07-30 NOTE — Progress Notes (Signed)
Subjective:     Patient ID: Jack Smith, male   DOB: [birth year], 21 y.o.   MRN: [id number]  HPI Jack Smith comes in for followup. I just saw him several weeks ago for recurrent right perirectal abscess. He states since he was last seen in the area has had intermittent drainage. He denies any fevers or chills. He denies any diarrhea or constipation. The area is tender. He denies any incontinence. Sometimes it was draining pus. Occasionally it will drain blood.  Review of Systems Neg except for above    Objective:   Physical Exam WD, WN AAM in NAD Rectal - in right lateral position, there is spont drainage of purulent fluid. No cellulitis. +induration. Some mild fluctuance. Area about 1.5cm from anus    Assessment:     Recurrent right perirectal abscess, possible anal fistula    Plan:     rec I&D.   After obtaining verbal consent, the area was prepped with Betadine. Quarter percent Marcaine with epinephrine was injected into the area. I then made a 1 inch incision with a 15 blade. The cavity was probed with a Q-tip. It tracked about 2 cm posteriorly away from the anus. The wound was packed with quarter-inch iodoform gauze and covered with a dry gauze. The patient tolerated the procedure well.  He was given a prescription for doxycycline for one week. He was given a prescription for Percocet. Followup 2 weeks. If area is persistent he will probably need an MRI of his pelvis given the extensive nature of his rectal surgery history  [name]. [name], MD, FACS General, Bariatric, & Minimally Invasive Surgery [facility] Surgery, [place]

## 2011-08-01 IMAGING — CR DG SHOULDER 2+V*R*
3 series · 3 of 3 positions shown · non-contrast
Comparison: None

CLINICAL DATA: Pain

RIGHT SHOULDER - 2+ VIEW

[view not recorded (1 of 3)]
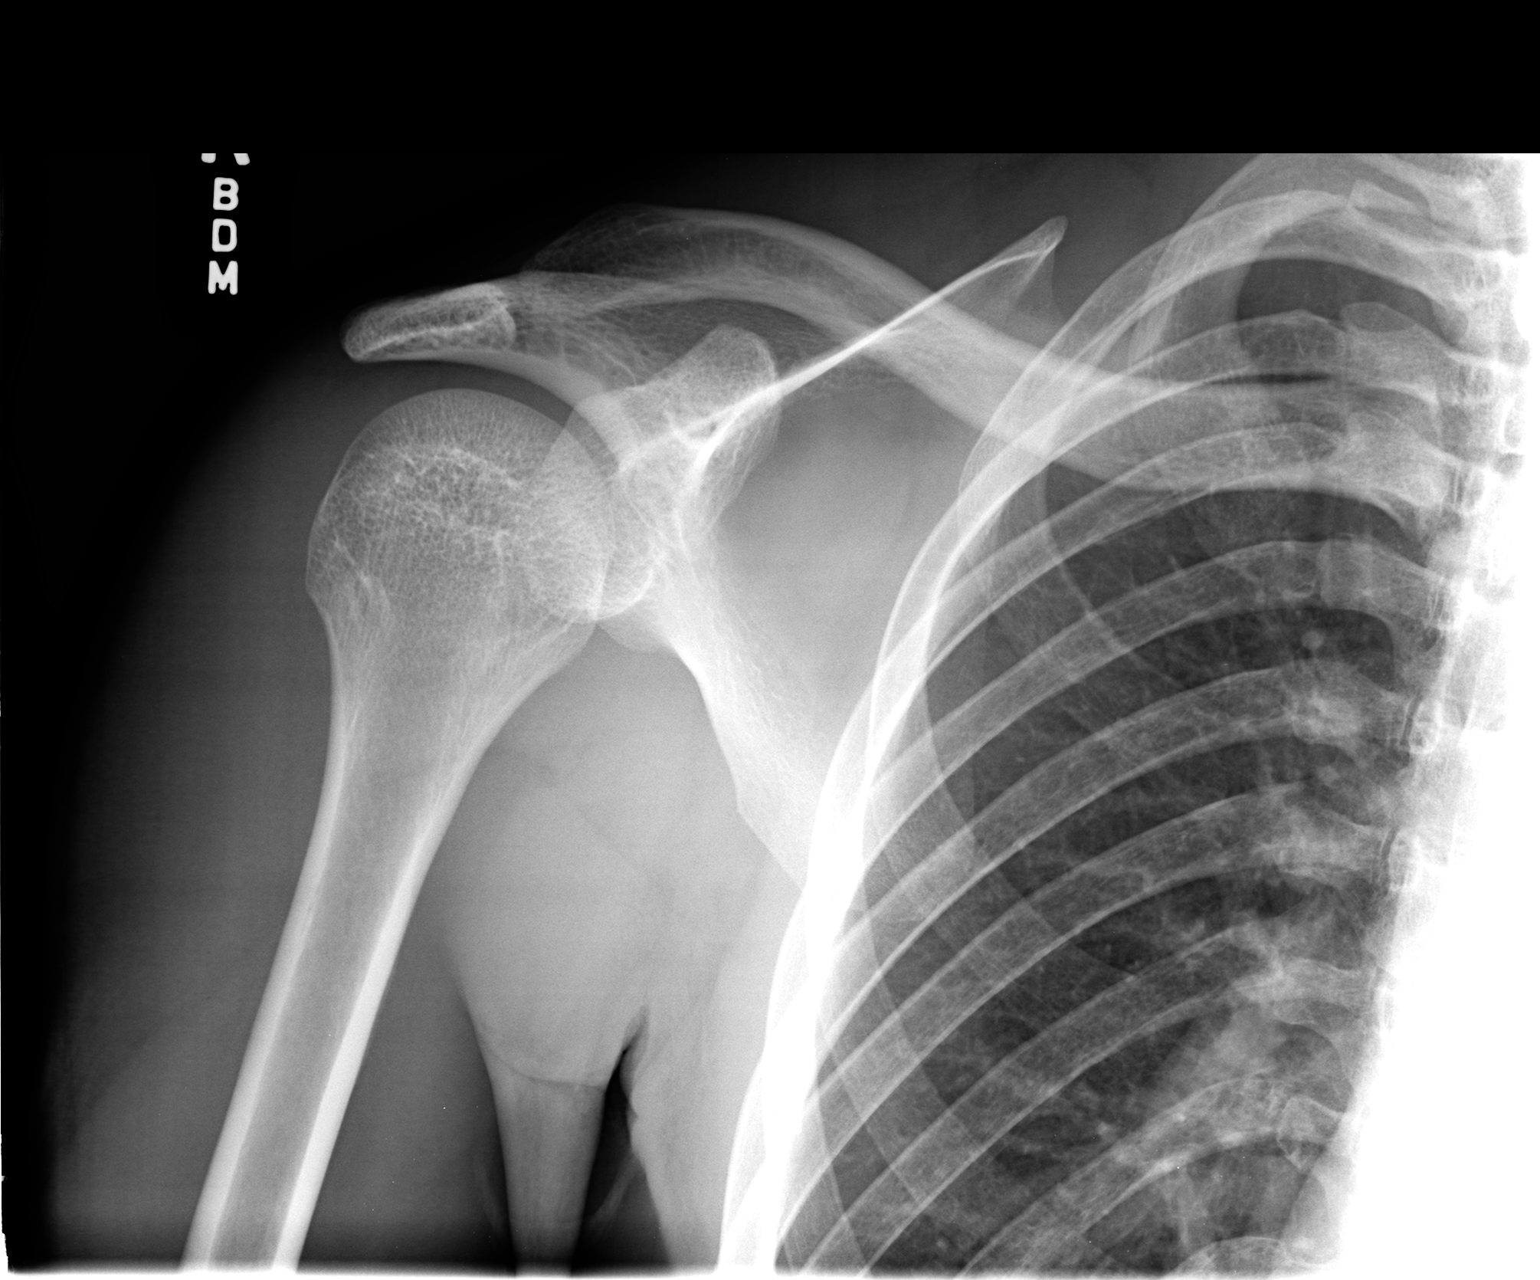

[view not recorded (2 of 3)]
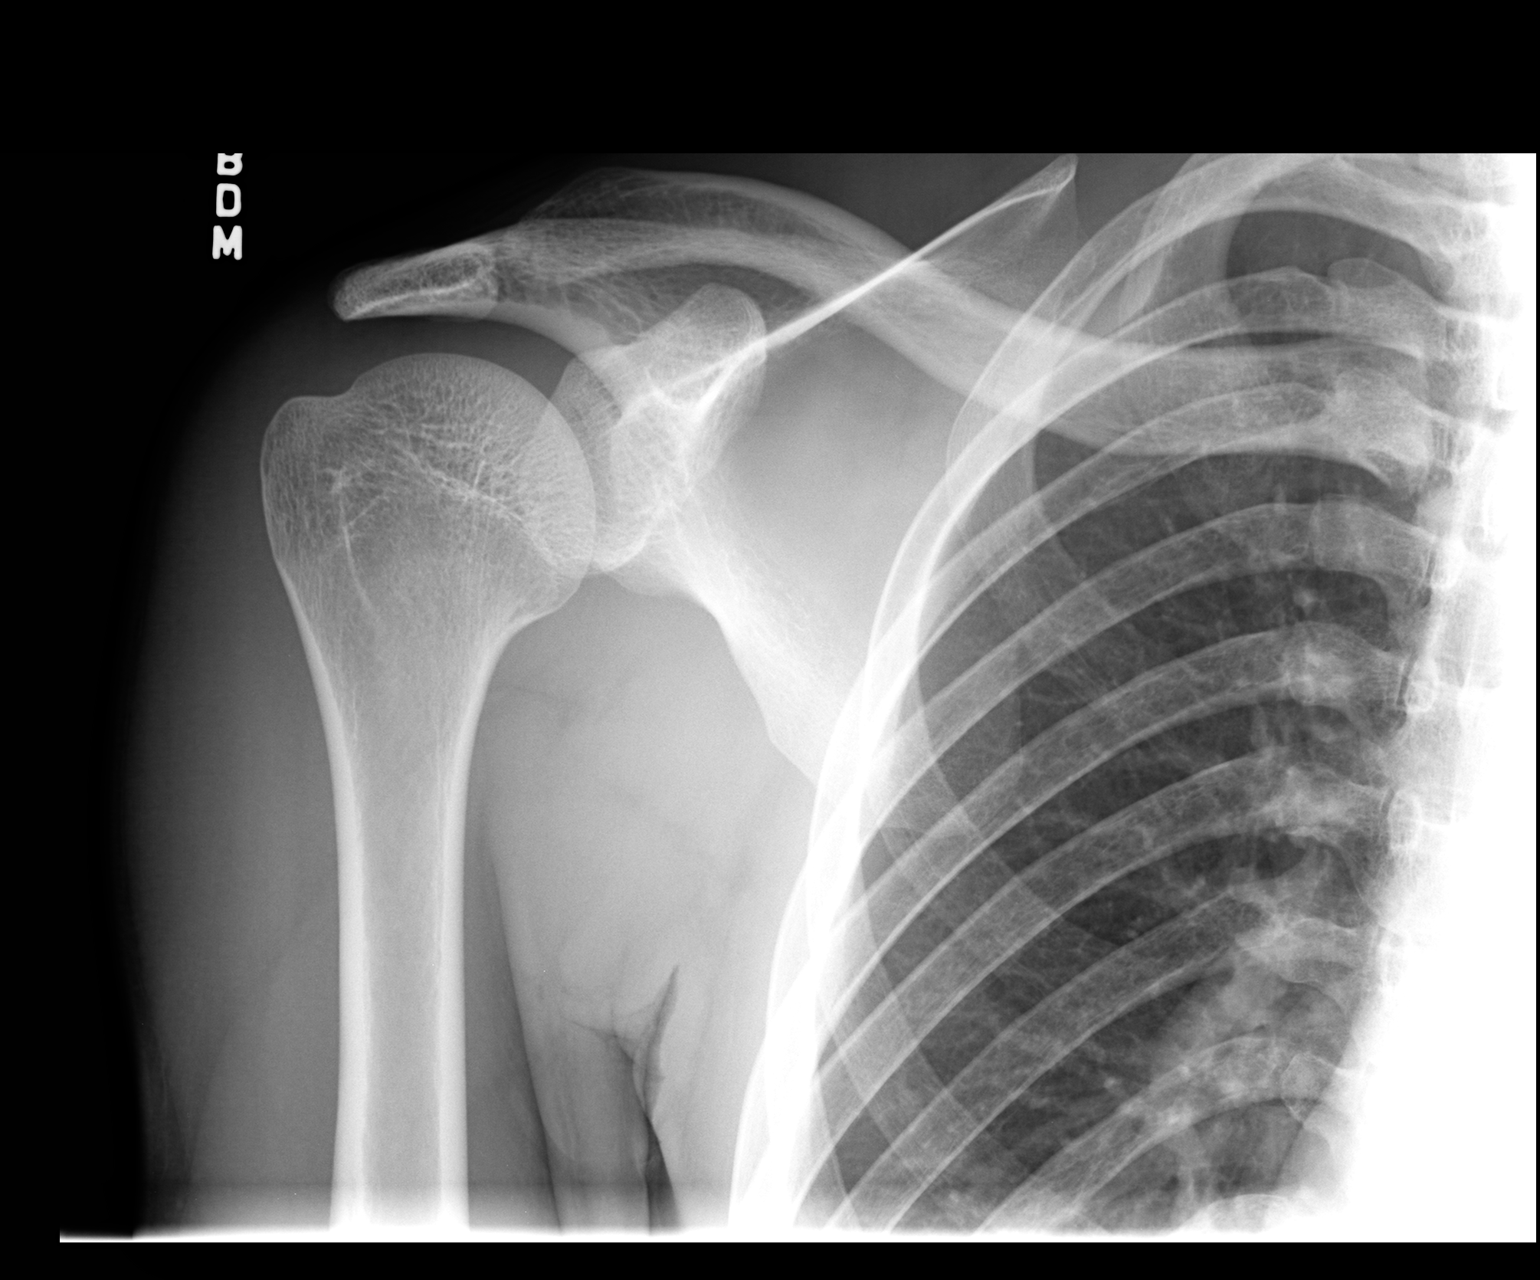

[view not recorded (3 of 3)]
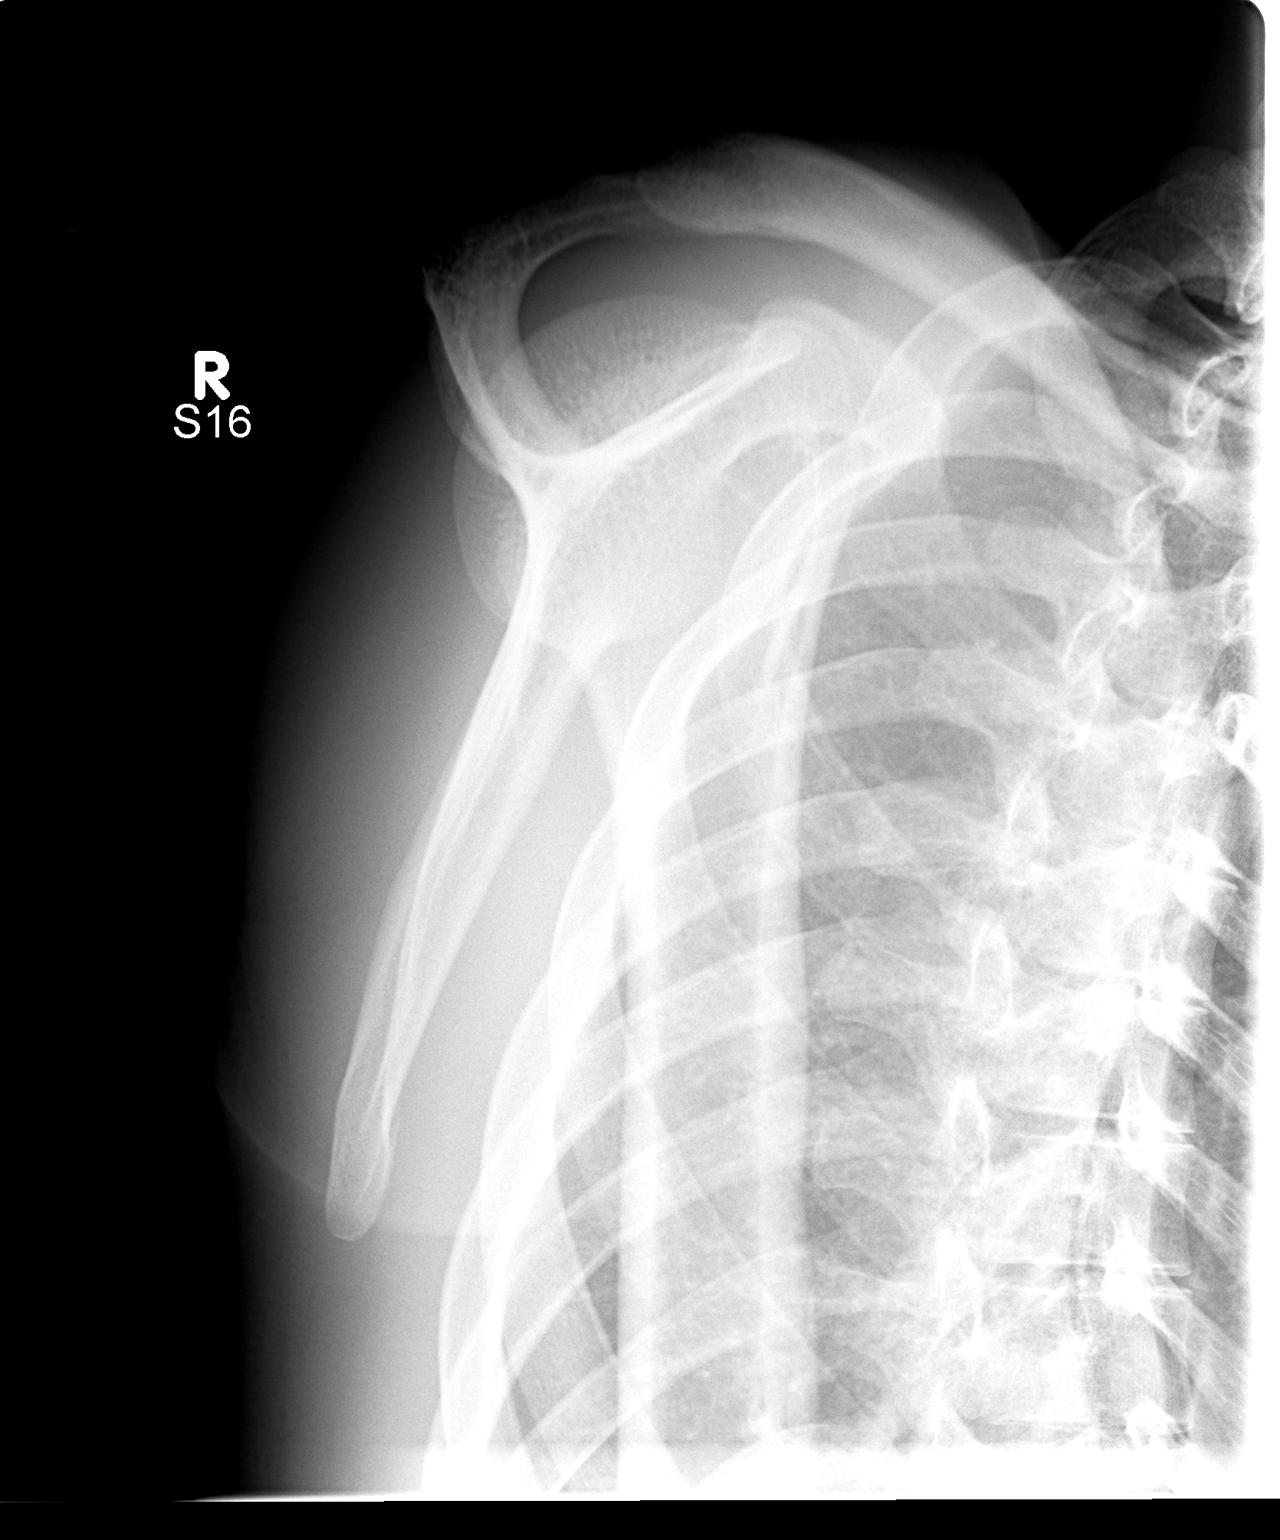

[3 of 3 positions shown; findings below may reference images not displayed]

FINDINGS: There is no evidence of fracture or dislocation.  There
is no evidence of arthropathy or other focal bone abnormality.
Soft tissues are unremarkable.
IMPRESSION: No acute findings.

## 2011-08-20 ENCOUNTER — Encounter (INDEPENDENT_AMBULATORY_CARE_PROVIDER_SITE_OTHER): Payer: Managed Care, Other (non HMO) | Admitting: General Surgery

## 2011-09-01 ENCOUNTER — Ambulatory Visit (INDEPENDENT_AMBULATORY_CARE_PROVIDER_SITE_OTHER): Payer: Managed Care, Other (non HMO) | Admitting: General Surgery

## 2011-09-01 ENCOUNTER — Encounter (INDEPENDENT_AMBULATORY_CARE_PROVIDER_SITE_OTHER): Payer: Self-pay | Admitting: General Surgery

## 2011-09-01 VITALS — BP 120/76 | HR 64 | Temp 97.1°F | Resp 14 | Ht 73.0 in | Wt 171.1 lb

## 2011-09-01 DIAGNOSIS — Z09 Encounter for follow-up examination after completed treatment for conditions other than malignant neoplasm: Secondary | ICD-10-CM

## 2011-09-01 NOTE — Patient Instructions (Signed)
Call if symptoms return 

## 2011-09-01 NOTE — Progress Notes (Signed)
Subjective:     Patient ID: Jack Smith, male   DOB: [birth year], 21 y.o.   MRN: [id number]  HPI 21 year old [place] American male comes in for followup. I last saw him on February 15. At that time he had a recurrence of his right perirectal abscess. The patient has an extensive anorectal history. At the last visit, it was spontaneously draining. However I enlarged the skin incision to help with drainage. Since he was last seen, he states that he has had ongoing drainage from the area. This occurred every day. However the drainage stopped this past Monday. He thinks the incision is now closed. He denies any perirectal pain. He denies any fevers or chills. He denies any dysuria. He denies any diarrhea or constipation. He denies any pain with bowel movements. He denies any incontinence  Review of Systems See above    Objective:   Physical Exam WD, WN AAM in nad abd soft, nt, nd Rectal - healed RT perirectal incision. No cellulitis, no induration, no fluctuance. Nontender. Good rectal tone. No masses or fluctuance on DRE    Assessment:     Recurrent Right perirectal abscess - appears healed    Plan:     This area appears to have healed. However this was his second recurrence. This raises the concern for possible fistula. However he is asymptomatic at this time. If he has a recurrence of this abscess, he will need an MRI of his pelvis. Followup p.r.n. or if symptoms return  [name]. [name], MD, FACS General, Bariatric, & Minimally Invasive Surgery [facility] Surgery, [place]

## 2011-10-07 ENCOUNTER — Telehealth (INDEPENDENT_AMBULATORY_CARE_PROVIDER_SITE_OTHER): Payer: Self-pay | Admitting: General Surgery

## 2011-10-07 DIAGNOSIS — L24A9 Irritant contact dermatitis due friction or contact with other specified body fluids: Secondary | ICD-10-CM

## 2011-10-07 DIAGNOSIS — T148XXA Other injury of unspecified body region, initial encounter: Secondary | ICD-10-CM

## 2011-10-07 NOTE — Telephone Encounter (Signed)
Pt calling to report his "wound had reopened again."  He has history of peri-rectal abscess and states that Dr. [name] had mentioned obtaining an MRI if it reoccurred.  (Pt seen 07/30/11 and 09/01/11 and Dr. [name] made such a note at each office visit.)  Pt is in school and not available for MRI on Tuesdays or Thursdays at all.  He is available from 12:00-3:00 on Monday and Wednesday or after 5:00, available after 12:00 on Friday.  Please let him know.

## 2011-10-08 NOTE — Telephone Encounter (Signed)
Dr [name] please advise if need to proceed with MR.

## 2011-10-11 NOTE — Telephone Encounter (Signed)
Given to referral coordinator to set up.

## 2011-10-11 NOTE — Telephone Encounter (Signed)
Addended byLiliana Cline on: 10/11/2011 11:11 AM   Modules accepted: Orders

## 2011-10-11 NOTE — Telephone Encounter (Signed)
Ok per Dr [name] to set up MR.

## 2011-10-12 NOTE — Telephone Encounter (Signed)
Addended byLiliana Cline on: 10/12/2011 09:55 AM   Modules accepted: Orders

## 2011-10-15 ENCOUNTER — Ambulatory Visit
Admission: RE | Admit: 2011-10-15 | Discharge: 2011-10-15 | Disposition: A | Discharge status: Home / Self Care | Payer: Managed Care, Other (non HMO) | Source: Ambulatory Visit | Attending: General Surgery | Admitting: General Surgery

## 2011-10-15 DIAGNOSIS — T148XXA Other injury of unspecified body region, initial encounter: Secondary | ICD-10-CM

## 2011-10-15 DIAGNOSIS — L24A9 Irritant contact dermatitis due friction or contact with other specified body fluids: Secondary | ICD-10-CM

## 2011-10-15 MED ORDER — GADOBENATE DIMEGLUMINE 529 MG/ML IV SOLN
15.0000 mL | Freq: Once | INTRAVENOUS | Status: AC | PRN
  Administered 2011-10-15: 15 mL via INTRAVENOUS

## 2011-10-19 ENCOUNTER — Telehealth (INDEPENDENT_AMBULATORY_CARE_PROVIDER_SITE_OTHER): Payer: Self-pay | Admitting: General Surgery

## 2011-10-19 NOTE — Telephone Encounter (Signed)
Spoke with pt regarding his results of his MRI pelvis from the other day. First, the pt is doing well. Having intermittent drainage from known rt inner buttock abscess. No f/c/n/v/dysuria. Taking school finals. No incontinence. Discussed at length finding of persistent right sided buttock abscess extending toward posterior midline with probable disruption of external sphincter in post midline- with probable fistula. Have recommended we refer him to a tertiary medical center to see colorectal specialist. Discussed potential referral to [facility], [facility], [facility]. Pt thinks his mom will want him referred to [facility] (she lives in [place] area). He said he would discuss it with his mother and contact our office of where he would like to be referred. We discussed signs and symptoms of worsening infection.   [name]. [name], MD, FACS General, Bariatric, & Minimally Invasive Surgery [facility] Surgery, [place]

## 2011-11-09 ENCOUNTER — Telehealth (INDEPENDENT_AMBULATORY_CARE_PROVIDER_SITE_OTHER): Payer: Self-pay | Admitting: General Surgery

## 2011-11-09 DIAGNOSIS — K611 Rectal abscess: Secondary | ICD-10-CM

## 2011-11-09 NOTE — Telephone Encounter (Signed)
Spoke with patient, he is staying here for the summer. He is requesting referral to [facility]. He does have an active area he believes needs lanced. Appt made for tomorrow with Dr [name]. Referral placed and given to our coordinator.

## 2011-11-09 NOTE — Telephone Encounter (Signed)
Message copied by Liliana Cline on Tue Nov 09, 2011  1:32 PM ------      Message from: Zacarias Pontes      Created: Tue Nov 09, 2011 11:57 AM       Pt having same problems ..he said that DR EW was going to referr him to another Careers adviser since he has had 5 sx`s and now needs another..pls call him at [phone]

## 2011-11-10 ENCOUNTER — Ambulatory Visit (INDEPENDENT_AMBULATORY_CARE_PROVIDER_SITE_OTHER): Payer: Managed Care, Other (non HMO) | Admitting: General Surgery

## 2011-11-10 ENCOUNTER — Encounter (INDEPENDENT_AMBULATORY_CARE_PROVIDER_SITE_OTHER): Payer: Self-pay | Admitting: General Surgery

## 2011-11-10 VITALS — BP 126/74 | HR 72 | Temp 96.8°F | Resp 14 | Ht 73.0 in | Wt 166.0 lb

## 2011-11-10 DIAGNOSIS — K603 Anal fistula, unspecified: Secondary | ICD-10-CM

## 2011-11-10 DIAGNOSIS — K611 Rectal abscess: Secondary | ICD-10-CM

## 2011-11-10 DIAGNOSIS — K612 Anorectal abscess: Secondary | ICD-10-CM

## 2011-11-10 MED ORDER — DOXYCYCLINE HYCLATE 50 MG PO CAPS: 100.0000 mg | ORAL_CAPSULE | Freq: Two times a day (BID) | ORAL | Status: AC

## 2011-11-10 MED ORDER — OXYCODONE HCL 5 MG PO TABS: 5.0000 mg | ORAL_TABLET | ORAL | Status: AC | PRN

## 2011-11-10 NOTE — Patient Instructions (Signed)
Abscess Care After An abscess (also called a boil or furuncle) is an infected area that contains a collection of pus. Signs and symptoms of an abscess include pain, tenderness, redness, or hardness, or you may feel a moveable soft area under your skin. An abscess can occur anywhere in the body. The infection may spread to surrounding tissues causing cellulitis. A cut (incision) by the surgeon was made over your abscess and the pus was drained out. Gauze may have been packed into the space to provide a drain that will allow the cavity to heal from the inside outwards. The boil may be painful for 5 to 7 days. Most people with a boil do not have high fevers. Your abscess, if seen early, may not have localized, and may not have been lanced. If not, another appointment may be required for this if it does not get better on its own or with medications.  HOME CARE INSTRUCTIONS   Only take over-the-counter or prescription medicines for pain, discomfort, or fever as directed by your caregiver.   When you bathe, soak and then remove gauze or iodoform packs on FRIDAY. You may then wash the wound gently with mild soapy water. Cover with gauze or do as your caregiver directs.   SEEK IMMEDIATE MEDICAL CARE IF:   You develop increased pain, swelling, redness, drainage, or bleeding in the wound site.   You develop signs of generalized infection including muscle aches, chills, fever, or a general ill feeling.   An oral temperature above 102 F (38.9 C) develops, not controlled by medication.  See your caregiver for a recheck if you develop any of the symptoms described above. If medications (antibiotics) were prescribed, take them as directed.

## 2011-11-10 NOTE — Progress Notes (Signed)
Subjective:     Patient ID: Jack Smith, male   DOB: [birth year], 21 y.o.   MRN: [id number]  HPI Jack Smith comes back in for followup. I last saw him on April 1. At that time, his right perirectal abscess had resolved but I was very concerned that he probably had a recurrent fistula. He called several weeks later stating that he had some recurrent intermittent drainage; therefore, we ordered an MRI of his pelvis which demonstrated a recurrent abscess with fistula formation. We had a prolonged conversation over the phone. I recommended referral to a colorectal surgeon at an academic [facility]. We discussed several possibilities to where to refer him to. He was initially planning to go home for the summer so he thought his mother would prefer him to be seen at [facility]. I told him to contact her office when he decided on where he would like to be referred to. We had not heard from him until yesterday when he called in complaining more drainage. He states that he did not have any worsening pain or much pain at all. His main concern was that he was having more heavy blood-tinged purulent drainage. He denies any fecal incontinence. He denies any trouble urinating. He denies any fevers or chills. He is staying in [place] for the summer and would like to be referred to [facility].   PMHx, PSHx, SOCHx, FAMHx, ALL reviewed and unchanged  Fall 2011 - EUA, excision and fulguration of anal canal warts  10/12/10 - EUA, excision and fulguration of recurrent anal canal warts, seton placement in left posterolateral fistula  11/06/10- EUA, I&D right ischiorectal abscess  02/22/11 - EUA, fistulotomy of left posterolateral fistula  Review of Systems As above    Objective:   Physical Exam  Genitourinary:      BP 126/74  Pulse 72  Temp(Src) 96.8 F (36 C) (Temporal)  Resp 14  Ht 6\' 1"  (1.854 m)  Wt 166 lb (75.297 kg)  BMI 21.90 kg/m2 Alert, nad Rectal - Right posterolateral about 2cm from anal verge  - about 4mm skin defect with granulation tissue that I can express pus. Induration extending posteriorly about 2cm toward coccyx. Good tone. No perianal warts.  MRI PELVIS WITHOUT AND WITH CONTRAST 10/12/10 Technique: Multiplanar multisequence MR imaging of the pelvis was  performed both before and after administration of intravenous  contrast.  Contrast: 15mL MULTIHANCE GADOBENATE DIMEGLUMINE 529 MG/ML IV SOLN   Comparison: Prior study 11/06/2010.   Findings: There is a grade 4 trans-sphincteric perianal fistula  with an abscess. This originates near the rectoanal junction in  the 6 o'clock position and extends through a wide, likely surgical,  defect in the external sphincter. It then crosses the midline to  the right extending into the ischiorectal fossa where it  communicates with the skin in the lower right buttock region.  No significant intrapelvic abnormalities. No intrapelvic abscess.  The bladder, prostate gland and seminal vesicles are unremarkable.   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     Assessment:     Recurrent Right perirectal abscess with fistula    Plan:     Because he has recurrent induration of his right perianal skin I recommended opening up the skin defect to allow better drainage. He understands that this is not a definitive procedure. This is only a temporizing procedure until he  gets down to [facility]. I explained that more than likely they will place a seton initially.  After obtaining verbal consent, the right perirectal skin was cleaned with Betadine. 2% Xylocaine with epinephrine and sodium bicarbonate was infiltrated around the area of maximal induration. I then opened up the Delrosario skin defect slightly for about three quarters of an inch with a 15 blade. I probed the cavity with a Q-tip. It tracked about 3  cm posteriorly toward his coccyx. The cavity was packed with quarter inch iodoform gauze. He tolerated the procedure well.  He was given wound care instructions. I told to leave in the packing until Friday. He was given a prescription for antibiotics for 10 days as well as a short course pain medicine. He was given a note for work and school for today.  We'll try to get him down to [facility] as quickly as possible otherwise followup in 4 weeks.  [name]. [name], MD, FACS General, Bariatric, & Minimally Invasive Surgery [facility] Surgery, [place]

## 2011-11-11 ENCOUNTER — Telehealth (INDEPENDENT_AMBULATORY_CARE_PROVIDER_SITE_OTHER): Payer: Self-pay | Admitting: General Surgery

## 2011-11-11 NOTE — Telephone Encounter (Addendum)
Patient called experiencing an episode of emesis after taking doxycycline. He did not take with food. I advised patient to make sure to have eaten something prior to taking doxycycline. He has been on this before with no problems. Patient will try this and call me if he has any additional episodes and I would speak with Dr [name] about switching his antibiotics. He agrees with this plan and will call if needed.   If has additional emesis, stop doxy and switch to bactrim DS, 1 tab by mouth bid x 7 days - wilson

## 2011-11-16 ENCOUNTER — Telehealth (INDEPENDENT_AMBULATORY_CARE_PROVIDER_SITE_OTHER): Payer: Self-pay | Admitting: General Surgery

## 2011-11-16 NOTE — Telephone Encounter (Signed)
Per Dr [name] after last phone message - If has additional emesis, stop doxy and switch to bactrim DS, 1 tab by mouth bid x 7 days

## 2011-11-16 NOTE — Telephone Encounter (Signed)
Called in meds, per Dr. Tawana Scale note and notified pt to pick up the new antibiotic.

## 2011-11-16 NOTE — Telephone Encounter (Signed)
Pt calling to report nausea and vomiting with current antibiotic.  He has tried taking it with food and without food.  Can we please change it to something else?  Call to [facility]: [address]:  [phone] and let pt know.

## 2011-11-17 ENCOUNTER — Telehealth (INDEPENDENT_AMBULATORY_CARE_PROVIDER_SITE_OTHER): Payer: Self-pay | Admitting: General Surgery

## 2011-11-17 NOTE — Telephone Encounter (Signed)
MR Jack Smith CALLED TO SAY THAT HE STOPPED PREVIOUS ANTIBIOTIC YESTERDAY AND THAT HE HAS HAD VOMITING TODAY. IS THIS RELATED TO ABSCESS?. SHOULD HE CONTINUE PREVIOUS ANTIBIOTIC? CONTINUE PREVIOUS ANTIBIOTIC AND VOMITING SHOULD NOT BE RELATED TO HIS ABSCESS. HE SHOULD CONTACT HIS MEDICAL DR. [name] Jack Smith PER DR./GY

## 2011-11-17 NOTE — Telephone Encounter (Signed)
I WAS UNABLE TO CONTACT PT AND HE CAME TO OFFICE RE MORE NAUSEA AND VOMITING/ HE WAS INSTRUCTED TO SEE MEDICAL DR. HE IS A STUDENT AND HAS NO MEDICAL DR. PT WAS INSTRUCTED TO GO TO URGENT CARE FACILITY RE NAUSEA /VOMITING ISSUE PER JADE/DR. WILSON/GY

## 2011-12-22 ENCOUNTER — Encounter (INDEPENDENT_AMBULATORY_CARE_PROVIDER_SITE_OTHER): Payer: Managed Care, Other (non HMO) | Admitting: General Surgery

## 2012-09-07 IMAGING — CR DG ABDOMEN ACUTE W/ 1V CHEST
3 series · 3 of 3 positions shown · non-contrast
Comparison: 04/10/2010 chest x-ray.  01/23/2010 abdominal films.

CLINICAL DATA: Rectal bleeding.

ACUTE ABDOMEN SERIES (ABDOMEN 2 VIEW & CHEST 1 VIEW)

[w chest pa]
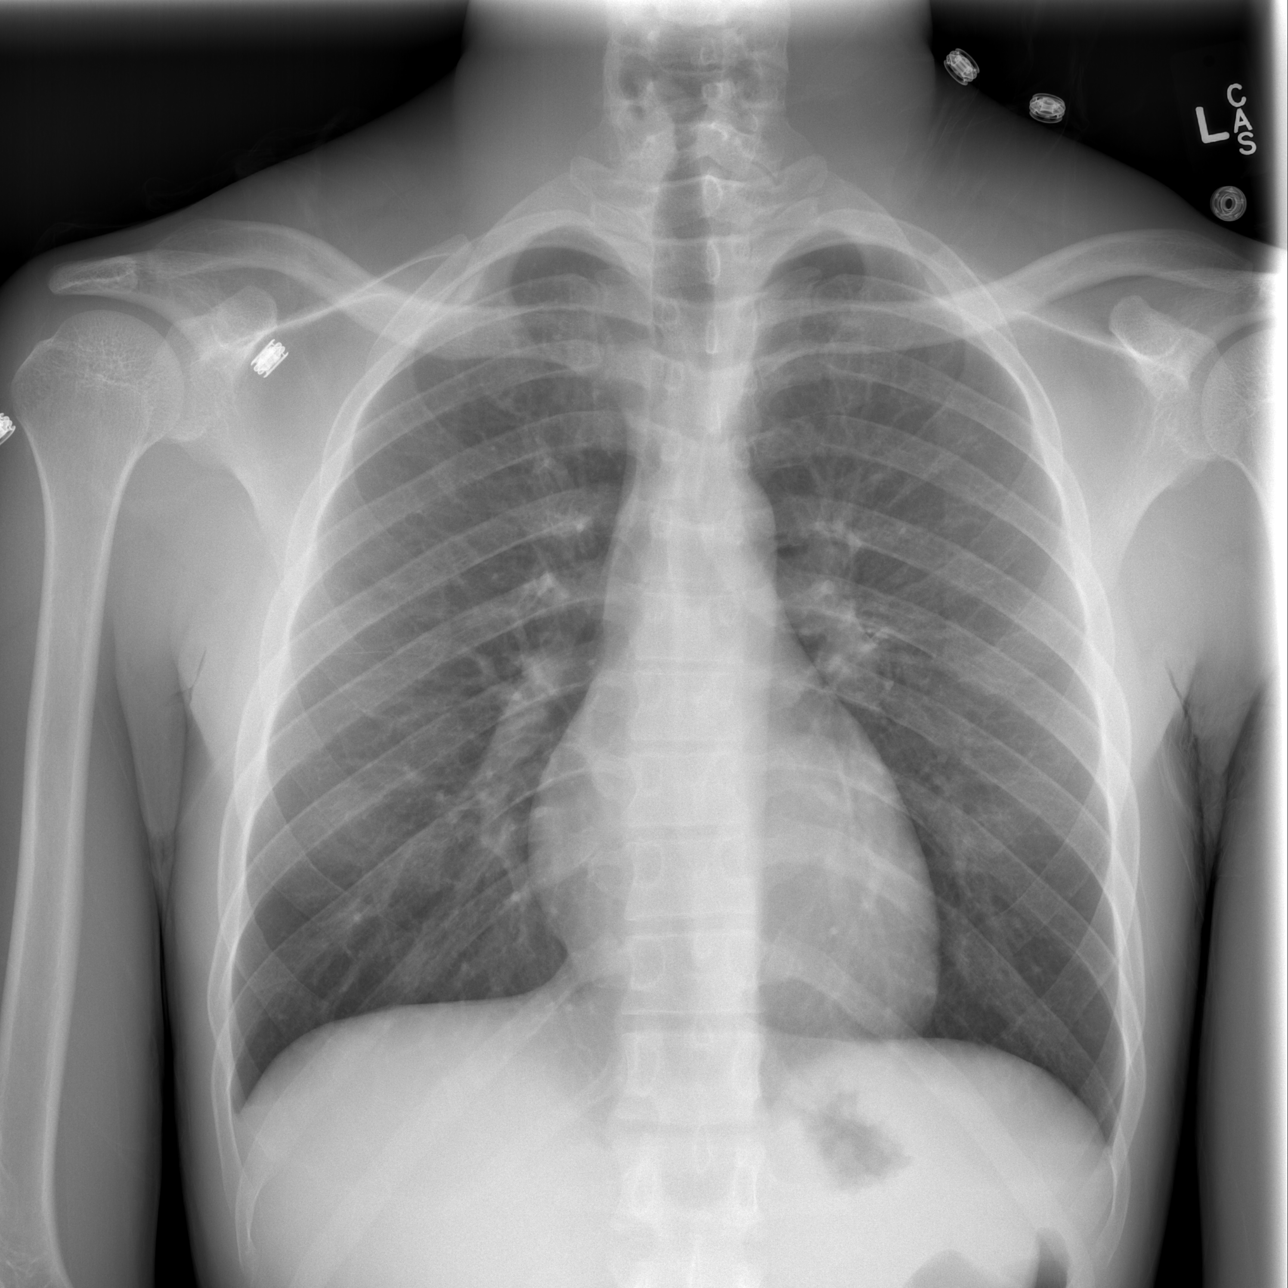

[w abdomen upright *]
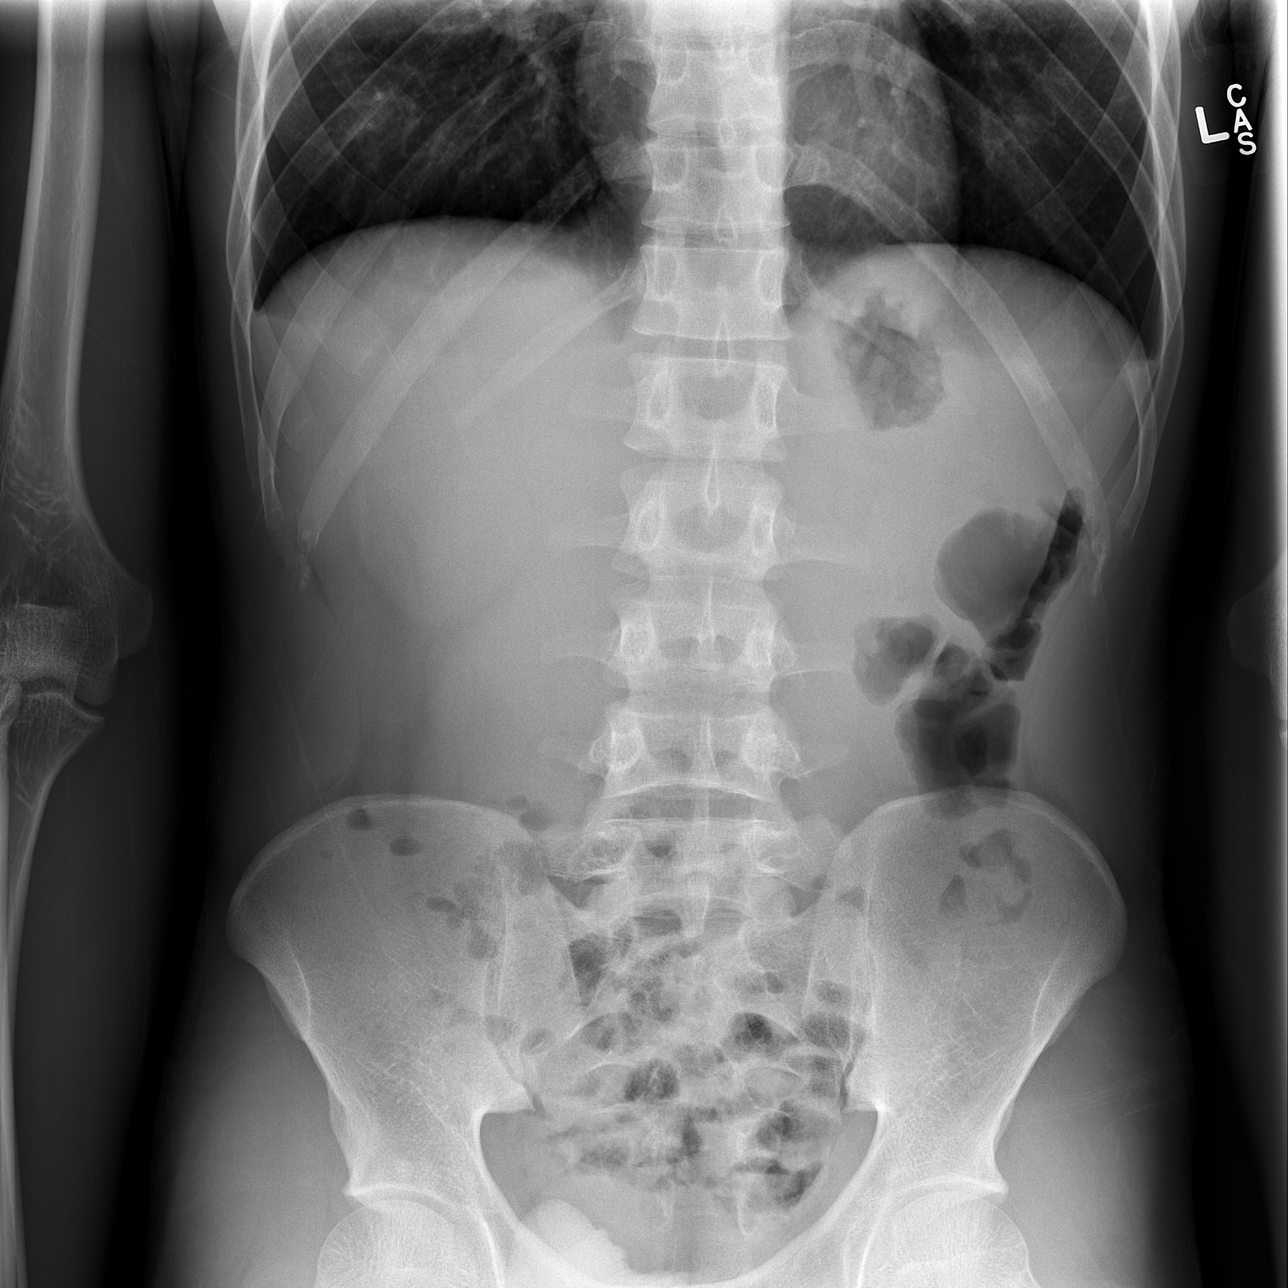

[t abdomen supine]
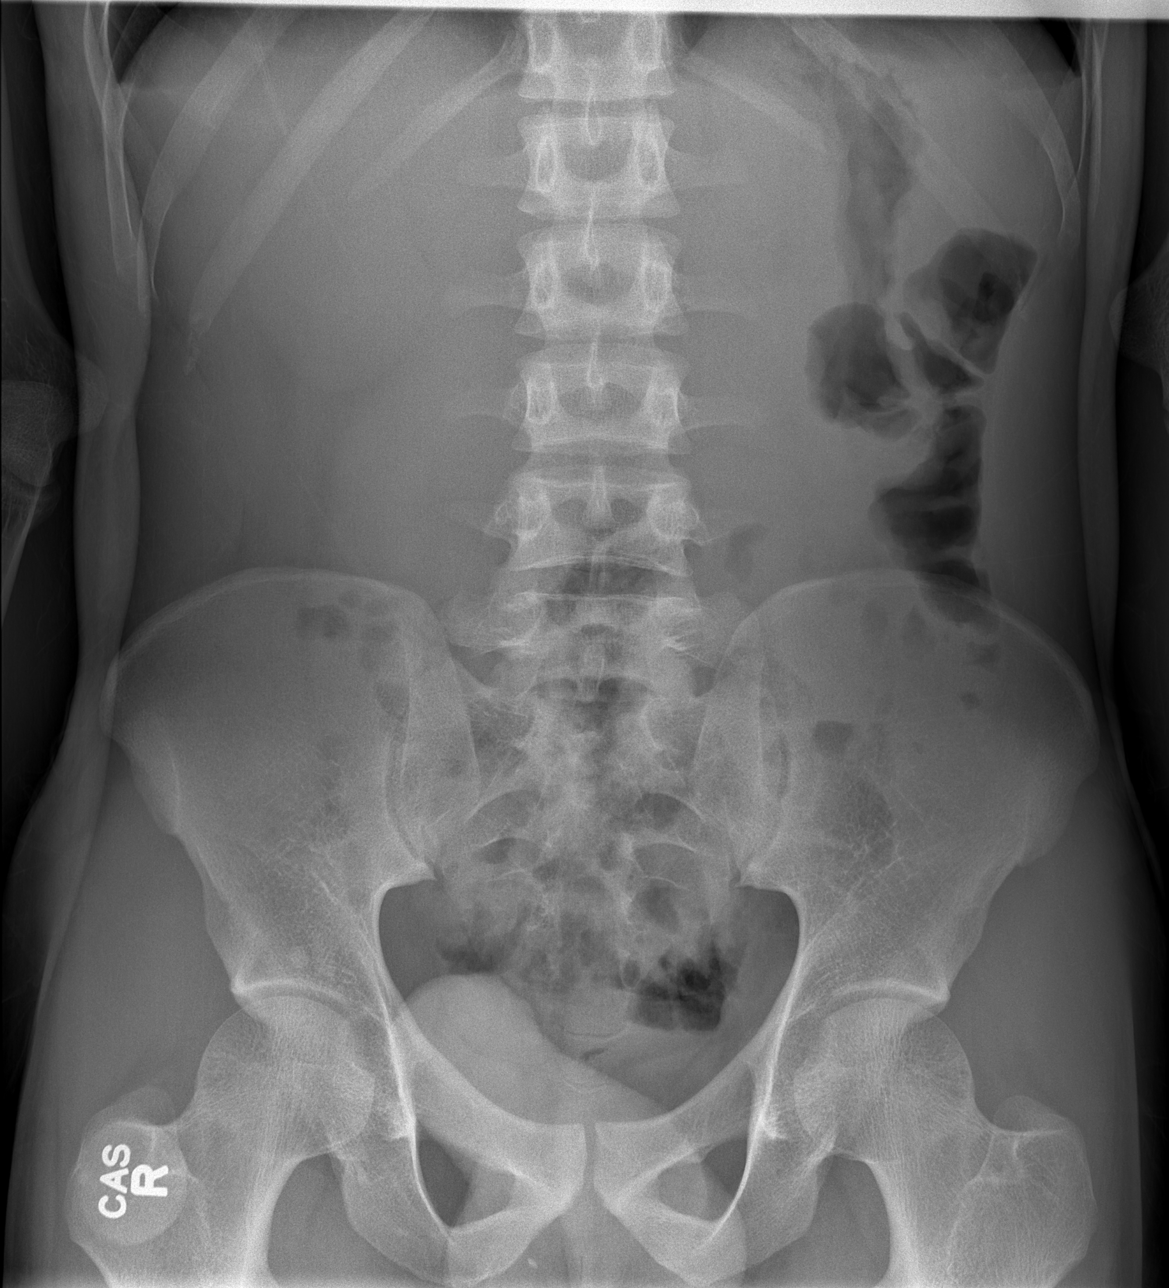

[3 of 3 positions shown; findings below may reference images not displayed]

FINDINGS: No infiltrate, congestive heart failure or pneumothorax.
Heart size within normal limits.

Nonspecific bowel gas pattern without plain film evidence of bowel
obstruction or free intraperitoneal air.  The gas filled colon at
the level splenic flexure appears to have slightly thickened folds.
IMPRESSION: Nonspecific bowel gas pattern with mild colonic fold prominence
splenic flexure region.

No plain film evidence of bowel obstruction or free peritoneal air.

## 2012-10-10 ENCOUNTER — Other Ambulatory Visit: Payer: Self-pay | Admitting: *Deleted

## 2012-10-10 DIAGNOSIS — R002 Palpitations: Secondary | ICD-10-CM

## 2013-11-20 ENCOUNTER — Emergency Department (HOSPITAL_COMMUNITY)
Admission: EM | Admit: 2013-11-20 | Discharge: 2013-11-20 | Disposition: A | Discharge status: Home / Self Care | Payer: Managed Care, Other (non HMO) | Attending: Emergency Medicine | Admitting: Emergency Medicine

## 2013-11-20 ENCOUNTER — Emergency Department (HOSPITAL_COMMUNITY): Payer: Managed Care, Other (non HMO)

## 2013-11-20 ENCOUNTER — Encounter (HOSPITAL_COMMUNITY): Payer: Self-pay | Admitting: Emergency Medicine

## 2013-11-20 DIAGNOSIS — Y9389 Activity, other specified: Secondary | ICD-10-CM | POA: Insufficient documentation

## 2013-11-20 DIAGNOSIS — W2209XA Striking against other stationary object, initial encounter: Secondary | ICD-10-CM | POA: Insufficient documentation

## 2013-11-20 DIAGNOSIS — IMO0002 Reserved for concepts with insufficient information to code with codable children: Secondary | ICD-10-CM | POA: Insufficient documentation

## 2013-11-20 DIAGNOSIS — Y929 Unspecified place or not applicable: Secondary | ICD-10-CM | POA: Insufficient documentation

## 2013-11-20 DIAGNOSIS — S62629A Displaced fracture of medial phalanx of unspecified finger, initial encounter for closed fracture: Secondary | ICD-10-CM

## 2013-11-20 DIAGNOSIS — J45909 Unspecified asthma, uncomplicated: Secondary | ICD-10-CM | POA: Insufficient documentation

## 2013-11-20 MED ORDER — IBUPROFEN 600 MG PO TABS: 600.0000 mg | ORAL_TABLET | Freq: Three times a day (TID) | ORAL | Status: AC | PRN

## 2013-11-20 MED ORDER — IBUPROFEN 200 MG PO TABS
600.0000 mg | ORAL_TABLET | Freq: Once | ORAL | Status: AC
  Administered 2013-11-20: 600 mg via ORAL
  Filled 2013-11-20: qty 3

## 2013-11-20 NOTE — Discharge Instructions (Signed)
Finger Fracture  Fractures of fingers are breaks in the bones of the fingers. There are many types of fractures. There are different ways of treating these fractures. Your health care provider will discuss the best way to treat your fracture.  CAUSES  Traumatic injury is the main cause of broken fingers. These include:  · Injuries while playing sports.  · Workplace injuries.  · Falls.  RISK FACTORS  Activities that can increase your risk of finger fractures include:  · Sports.  · Workplace activities that involve machinery.  · A condition called osteoporosis, which can make your bones less dense and cause them to fracture more easily.  SIGNS AND SYMPTOMS  The main symptoms of a broken finger are pain and swelling within 15 minutes after the injury. Other symptoms include:  · Bruising of your finger.  · Stiffness of your finger.  · Numbness of your finger.  · Exposed bones (compound fracture) if the fracture is severe.  DIAGNOSIS   The best way to diagnose a broken bone is with X-ray imaging. Additionally, your health care provider will use this X-ray image to evaluate the position of the broken finger bones.   TREATMENT   Finger fractures can be treated with:   · Nonreduction This means the bones are in place. The finger is splinted without changing the positions of the bone pieces. The splint is usually left on for about a week to 10 days. This will depend on your fracture and what your health care provider thinks.  · Closed reduction The bones are put back into position without using surgery. The finger is then splinted.  · Open reduction and internal fixation The fracture site is opened. Then the bone pieces are fixed into place with pins or some type of hardware. This is seldom required. It depends on the severity of the fracture.  HOME CARE INSTRUCTIONS   · Follow your health care provider's instructions regarding activities, exercises, and physical therapy.  · Only take over-the-counter or prescription  medicines for pain, discomfort, or fever as directed by your health care provider.  SEEK MEDICAL CARE IF:  You have pain or swelling that limits the motion or use of your fingers.  SEEK IMMEDIATE MEDICAL CARE IF:   Your finger becomes numb.  MAKE SURE YOU:   · Understand these instructions.  · Will watch your condition.  · Will get help right away if you are not doing well or get worse.  Document Released: 09/12/2000 Document Revised: 03/21/2013 Document Reviewed: 01/10/2013  [employer]® Patient Information ©2014 [employer].

## 2013-11-20 NOTE — ED Notes (Signed)
Pt reports L index finger, unable to bend the finger d/t pain. Pain is in the joint.  Swelling noted as well.  Denies injury.

## 2013-11-20 NOTE — ED Provider Notes (Signed)
CSN: [id number]     Arrival date & time 11/20/13  2058 History   First MD Initiated Contact with Patient 11/20/13 2201    This chart was scribed for non-physician practitioner, Antony Madura, PA, working with Raeford Razor, MD by Marica Otter, ED Scribe. This patient was seen in room WTR6/WTR6 and the patient's care was started at 10:47 PM.  PCP: PROVIDER NOT IN SYSTEM  Chief Complaint  Patient presents with  . Hand Pain   HPI HPI Comments: Jack Smith is a 23 y.o. male who presents to the Emergency Department complaining of left hand pain, specifically left index finger pain onset early Monday morning. Pt also complains of associated swelling. Pt specifies that the pain is in his joint; and states that he is unable to bend his left index finger due to pain. Pt reports that the pain is most intense in his distal phalanx. Pt rates his hand and ginger pain a 10 out of 10. Pt reports icing the injured area last night, but denies taking any other measures at home to alleviate his Sx. Pt reports that his left index finger was very dark yesterday, however, that has now resolved. Pt denies any specific injury to the left hand, however, notes he cannot say for certain. Pt states that he knows he punched a wall early Monday morning, however, is somewhat confident that he punched the wall with his right hand not left. Pt denies elbow pain or arm pain.   Past Medical History  Diagnosis Date  . Asthma   . Anal condyloma   . Anal fissure   . Fistula-in-ano   . Abscess    Past Surgical History  Procedure Laterality Date  . Exam under anesthesia with drainage of abscess  11/06/10    Dr Gaynelle Adu, right ischiorectal abscess  . Condyloma excision/fulguration  04/09/10    anal canal  . Condyloma excision/fulguration  10/11/09    placement of L posterolateral transphincteric seton; reexcision of anal canal warts  . Fistulotomy  02-22-11   Family History  Problem Relation Age of Onset  . Hypertension  Father   . Cancer Maternal Aunt     breast  . Cancer Maternal Grandmother     colon   History  Substance Use Topics  . Smoking status: Never Smoker   . Smokeless tobacco: Never Used  . Alcohol Use: Yes     Comment: "occasionally"    Review of Systems  Musculoskeletal:       Left hand and left index finger pain with associated swelling     Allergies  Review of patient's allergies indicates no known allergies.  Home Medications   Prior to Admission medications   Medication Sig Start Date End Date Taking? Authorizing Provider  albuterol (ACCUNEB) 1.25 MG/3ML nebulizer solution Take 1 ampule by nebulization as needed.      Historical Provider, MD  ibuprofen (ADVIL,MOTRIN) 600 MG tablet Take 1 tablet (600 mg total) by mouth every 8 (eight) hours as needed. 11/20/13   Antony Madura, PA-C   Triage Vitals: BP 149/94  Pulse 72  Temp(Src) 99.2 F (37.3 C) (Oral)  Resp 16  SpO2 99%  Physical Exam  Nursing note and vitals reviewed. Constitutional: He is oriented to person, place, and time. He appears well-developed and well-nourished. No distress.  Nontoxic/nonseptic appearing  HENT:  Head: Normocephalic and atraumatic.  Eyes: Conjunctivae and EOM are normal. No scleral icterus.  Neck: Normal range of motion.  Cardiovascular: Normal rate, regular rhythm  and intact distal pulses.   Distal radial pulse 2+ and left upper extremity. Capillary refill normal in all digits of left hand  Pulmonary/Chest: Effort normal. No respiratory distress.  Musculoskeletal: Normal range of motion.  Normal passive range of motion of left index finger. 5/5 strength against resistance of FDP, FDS, and extensors of left index finger. Mild swelling to left index finger noted. No induration, erythema, heat touch, crepitus, or deformity.  Neurological: He is alert and oriented to person, place, and time.  Sensation to light touch intact. Patient able to wiggle all fingers of left hand.  Skin: Skin is warm and  dry. No rash noted. He is not diaphoretic. No erythema. No pallor.  Psychiatric: He has a normal mood and affect. His behavior is normal.    ED Course  Procedures (including critical care time) DIAGNOSTIC STUDIES: Oxygen Saturation is 99% on RA, normal by my interpretation.    COORDINATION OF CARE:  10:52 PM-Discussed treatment plan which includes imaging with pt at bedside and pt agreed to plan.   Labs Review Labs Reviewed - No data to display  Imaging Review Dg Finger Index Left  11/20/2013   CLINICAL DATA:  Finger injury.  EXAM: LEFT INDEX FINGER 2+V  COMPARISON:  None.  FINDINGS: Avulsion injury of the volar plate at the base of the second middle phalanx with intra-articular extension. 3 mm bony fragment. No dislocation. No destructive bony lesions. Soft tissue planes are nonsuspicious.  IMPRESSION: Base of second middle phalanx volar plate avulsion injury without dislocation.   Electronically Signed   By: Awilda Metroourtnay  Bloomer   On: 11/20/2013 23:15     EKG Interpretation None      MDM   Final diagnoses:  Avulsion fracture of middle phalanx of finger    Avulsion fracture of middle phalanx of index finger left hand. Unknown mechanism; patient denies trauma/injury. Patient neurovascularly intact. No gross sensory deficits appreciated. No evidence of septic joint. Patient given static finger splint and hand specialist referral. RICE advised and return precautions provided. Patient agreeable to plan with no unaddressed concerns.  I personally performed the services described in this documentation, which was scribed in my presence. The recorded information has been reviewed and is accurate.   Filed Vitals:   11/20/13 2126 11/20/13 2139  BP: 145/101 149/94  Pulse: 72   Temp: 99.2 F (37.3 C)   TempSrc: Oral   Resp: 16   SpO2: 99%       Antony MaduraKelly Johannes Everage, PA-C 11/20/13 2346

## 2013-11-20 NOTE — ED Notes (Signed)
Patient is alert and oriented x3.  He was given DC instructions and follow up visit instructions.  Patient gave verbal understanding.  He was DC ambulatory under his own power to home.  V/S stable.  He was not showing any signs of distress on DC 

## 2013-11-25 NOTE — ED Provider Notes (Signed)
Medical screening examination/treatment/procedure(s) were conducted as a shared visit with non-physician practitioner(s) and myself.  I personally evaluated the patient during the encounter.   EKG Interpretation None     23ym with L index finger pain. XR with avulsion to volar plate. Splint. Pain meds. Hand surgery FU.   Dg Finger Index Left  11/20/2013   CLINICAL DATA:  Finger injury.  EXAM: LEFT INDEX FINGER 2+V  COMPARISON:  None.  FINDINGS: Avulsion injury of the volar plate at the base of the second middle phalanx with intra-articular extension. 3 mm bony fragment. No dislocation. No destructive bony lesions. Soft tissue planes are nonsuspicious.  IMPRESSION: Base of second middle phalanx volar plate avulsion injury without dislocation.   Electronically Signed   By: Awilda Metroourtnay  Bloomer   On: 11/20/2013 23:15   Raeford RazorStephen Deaysia Grigoryan, MD 11/25/13 [phone]
